# Patient Record
Sex: Male | Born: 1980 | Race: White | Hispanic: No | Marital: Married | Smoking: Former smoker
Health system: Southern US, Community
[De-identification: ages and names within clinical notes are randomized; demographics above are authoritative.]

## PROBLEM LIST (undated history)

## (undated) DIAGNOSIS — K219 Gastro-esophageal reflux disease without esophagitis: Secondary | ICD-10-CM

## (undated) HISTORY — PX: HERNIA REPAIR: SHX51

## (undated) HISTORY — DX: Gastro-esophageal reflux disease without esophagitis: K21.9

## (undated) HISTORY — PX: NO PAST SURGERIES: SHX2092

## (undated) HISTORY — PX: INGUINAL HERNIA REPAIR: SUR1180

---

## 1998-10-06 ENCOUNTER — Encounter: Payer: Self-pay | Admitting: Internal Medicine

## 1998-10-06 ENCOUNTER — Emergency Department (HOSPITAL_COMMUNITY): Admission: EM | Admit: 1998-10-06 | Discharge: 1998-10-06 | Payer: Self-pay | Admitting: Internal Medicine

## 2004-08-16 ENCOUNTER — Emergency Department (HOSPITAL_COMMUNITY): Admission: EM | Admit: 2004-08-16 | Discharge: 2004-08-16 | Payer: Self-pay | Admitting: Emergency Medicine

## 2004-09-26 ENCOUNTER — Emergency Department (HOSPITAL_COMMUNITY): Admission: EM | Admit: 2004-09-26 | Discharge: 2004-09-26 | Payer: Self-pay | Admitting: Emergency Medicine

## 2005-10-12 ENCOUNTER — Emergency Department (HOSPITAL_COMMUNITY): Admission: EM | Admit: 2005-10-12 | Discharge: 2005-10-12 | Payer: Self-pay | Admitting: Emergency Medicine

## 2011-09-10 ENCOUNTER — Ambulatory Visit: Payer: BC Managed Care – PPO

## 2011-09-10 ENCOUNTER — Ambulatory Visit (INDEPENDENT_AMBULATORY_CARE_PROVIDER_SITE_OTHER): Payer: BC Managed Care – PPO | Admitting: Emergency Medicine

## 2011-09-10 VITALS — BP 127/86 | HR 66 | Temp 98.0°F | Resp 16

## 2011-09-10 DIAGNOSIS — R079 Chest pain, unspecified: Secondary | ICD-10-CM

## 2011-09-10 DIAGNOSIS — M792 Neuralgia and neuritis, unspecified: Secondary | ICD-10-CM

## 2011-09-10 DIAGNOSIS — IMO0002 Reserved for concepts with insufficient information to code with codable children: Secondary | ICD-10-CM

## 2011-09-10 MED ORDER — MELOXICAM 7.5 MG PO TABS: ORAL_TABLET | ORAL | Status: DC

## 2011-09-10 MED ORDER — HYDROCODONE-ACETAMINOPHEN 5-325 MG PO TABS: ORAL_TABLET | ORAL | Status: DC

## 2011-09-10 NOTE — Patient Instructions (Signed)
Radicular Pain Radicular pain in either the arm or leg is usually from a bulging or herniated disk in the spine. A piece of the herniated disk may press against the nerves as the nerves exit the spine. This causes pain which is felt at the tips of the nerves down the arm or leg. Other causes of radicular pain may include:  Fractures.   Heart disease.   Cancer.   An abnormal and usually degenerative state of the nervous system or nerves (neuropathy).  Diagnosis may require CT or MRI scanning to determine the primary cause.  Nerves that start at the neck (nerve roots) may cause radicular pain in the outer shoulder and arm. It can spread down to the thumb and fingers. The symptoms vary depending on which nerve root has been affected. In most cases radicular pain improves with conservative treatment. Neck problems may require physical therapy, a neck collar, or cervical traction. Treatment may take many weeks, and surgery may be considered if the symptoms do not improve.  Conservative treatment is also recommended for sciatica. Sciatica causes pain to radiate from the lower back or buttock area down the leg into the foot. Often there is a history of back problems. Most patients with sciatica are better after 2 to 4 weeks of rest and other supportive care. Short term bed rest can reduce the disk pressure considerably. Sitting, however, is not a good position since this increases the pressure on the disk. You should avoid bending, lifting, and all other activities which make the problem worse. Traction can be used in severe cases. Surgery is usually reserved for patients who do not improve within the first months of treatment. Only take over-the-counter or prescription medicines for pain, discomfort, or fever as directed by your caregiver. Narcotics and muscle relaxants may help by relieving more severe pain and spasm and by providing mild sedation. Cold or massage can give significant relief. Spinal  manipulation is not recommended. It can increase the degree of disc protrusion. Epidural steroid injections are often effective treatment for radicular pain. These injections deliver medicine to the spinal nerve in the space between the protective covering of the spinal cord and back bones (vertebrae). Your caregiver can give you more information about steroid injections. These injections are most effective when given within two weeks of the onset of pain.  You should see your caregiver for follow up care as recommended. A program for neck and back injury rehabilitation with stretching and strengthening exercises is an important part of management.  SEEK IMMEDIATE MEDICAL CARE IF:  You develop increased pain, weakness, or numbness in your arm or leg.   You develop difficulty with bladder or bowel control.   You develop abdominal pain.  Document Released: 03/23/2004 Document Revised: 02/02/2011 Document Reviewed: 06/08/2008 [employer] Patient Information 2012 [employer]. 

## 2011-09-10 NOTE — Progress Notes (Signed)
  Subjective:    Patient ID: Mark Bright, male    DOB: 03/18/1980, 31 y.o.   MRN: [id number]  HPI patient enters with an episode of severe right-sided pain. This pain originated in his right forearm and traveled up his right deltoid area. He then developed severe pain on the right side of his chest and into his back he denies any pain in his neck. 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 He has not felt short of breath.    Review of Systems     Objective:   Physical Exam  Constitutional: He appears well-developed and well-nourished.  HENT:  Head: Normocephalic and atraumatic.  Right Ear: External ear normal.  Left Ear: External ear normal.  Eyes: Pupils are equal, round, and reactive to light.  Neck: No tracheal deviation present. No thyromegaly present.  Cardiovascular: Normal rate, regular rhythm and normal heart sounds.   Pulmonary/Chest: No respiratory distress. He has no wheezes. He has no rales.  Abdominal: He exhibits no distension. There is no tenderness.   EKG normal sinus rhythm no acute change  [facility] reading (PRIMARY) by  Dr. Cleta Alberts C-spine is no acute disease. Chest x-ray no acute disease       Assessment & Plan:  The pain he is having sounds nerve related to his severe aching-type sensation he feels in his right arm and has progressed to involve his chest the we'll treat with Mobic 7.5 twice a day with food and given hydrocodone at night. If he continues to have symptoms would advise referral to orthopedist

## 2011-11-01 ENCOUNTER — Encounter (HOSPITAL_BASED_OUTPATIENT_CLINIC_OR_DEPARTMENT_OTHER): Payer: Self-pay | Admitting: *Deleted

## 2011-11-01 ENCOUNTER — Emergency Department (HOSPITAL_BASED_OUTPATIENT_CLINIC_OR_DEPARTMENT_OTHER)
Admission: EM | Admit: 2011-11-01 | Discharge: 2011-11-02 | Disposition: A | Discharge status: Home / Self Care | Payer: BC Managed Care – PPO | Attending: Emergency Medicine | Admitting: Emergency Medicine

## 2011-11-01 ENCOUNTER — Emergency Department (HOSPITAL_BASED_OUTPATIENT_CLINIC_OR_DEPARTMENT_OTHER): Payer: BC Managed Care – PPO

## 2011-11-01 DIAGNOSIS — S3994XA Unspecified injury of external genitals, initial encounter: Secondary | ICD-10-CM

## 2011-11-01 DIAGNOSIS — S39848A Other specified injuries of external genitals, initial encounter: Secondary | ICD-10-CM | POA: Insufficient documentation

## 2011-11-01 DIAGNOSIS — Y9364 Activity, baseball: Secondary | ICD-10-CM | POA: Insufficient documentation

## 2011-11-01 DIAGNOSIS — Z880 Allergy status to penicillin: Secondary | ICD-10-CM | POA: Insufficient documentation

## 2011-11-01 DIAGNOSIS — W219XXA Striking against or struck by unspecified sports equipment, initial encounter: Secondary | ICD-10-CM | POA: Insufficient documentation

## 2011-11-01 LAB — URINALYSIS, ROUTINE W REFLEX MICROSCOPIC
Glucose, UA: NEGATIVE mg/dL
Leukocytes, UA: NEGATIVE
Nitrite: NEGATIVE
Specific Gravity, Urine: 1.028 (ref 1.005–1.030)
pH: 6 (ref 5.0–8.0)

## 2011-11-01 NOTE — ED Notes (Signed)
Pt c/o testicle pain after being hit by baseball x 1 hr ago

## 2011-11-01 NOTE — ED Provider Notes (Signed)
History     CSN: [id number]  Arrival date & time 11/01/11  2210   First MD Initiated Contact with Patient 11/01/11 2316      Chief Complaint  Patient presents with  . Testicle Pain    (Consider location/radiation/quality/duration/timing/severity/associated sxs/prior treatment) HPI Comments: Patient presents with pain, swelling in the left testicle after struck with a line drive softball during a game.  Has been able to void without difficulty.    Patient is a 31 y.o. male presenting with testicular pain. The history is provided by the patient.  Testicle Pain This is a new problem. The current episode started 1 to 2 hours ago. The problem occurs constantly. The problem has not changed since onset.Associated symptoms include abdominal pain. Exacerbated by: palpation, movement. Nothing relieves the symptoms. He has tried a cold compress for the symptoms. The treatment provided mild relief.    History reviewed. No pertinent past medical history.  History reviewed. No pertinent past surgical history.  History reviewed. No pertinent family history.  History  Substance Use Topics  . Smoking status: Never Smoker   . Smokeless tobacco: Not on file  . Alcohol Use: No      Review of Systems  Gastrointestinal: Positive for abdominal pain.  Genitourinary: Positive for testicular pain.  All other systems reviewed and are negative.    Allergies  Penicillins  Home Medications  No current outpatient prescriptions on file.  BP 105/45  Pulse 66  Temp 97.9 F (36.6 C) (Oral)  Resp 16  Ht 5\' 7"  (1.702 m)  Wt 150 lb (68.04 kg)  BMI 23.49 kg/m2  SpO2 100%  Physical Exam  Nursing note and vitals reviewed. Constitutional: He appears well-developed and well-nourished.  HENT:  Head: Normocephalic and atraumatic.  Neck: Normal range of motion. Neck supple.  Abdominal: Soft. Bowel sounds are normal. He exhibits no distension. There is no tenderness.  Genitourinary:       The  left testicle is ttp but minimal swelling and freely mobile within the scrotum.  Musculoskeletal: Normal range of motion. He exhibits no edema.  Skin: He is not diaphoretic.    ED Course  Procedures (including critical care time)  Labs Reviewed - No data to display No results found.   No diagnosis found.    MDM  The ultrasound fails to reveal any intrascrotal damage.  He will be discharged to home with pain meds, rest, time.  To return prn for any problems.        Geoffery Lyons, MD 11/02/11 [phone]

## 2011-11-02 MED ORDER — HYDROCODONE-ACETAMINOPHEN 5-500 MG PO TABS: 1.0000 | ORAL_TABLET | Freq: Four times a day (QID) | ORAL | Status: AC | PRN

## 2014-08-03 ENCOUNTER — Ambulatory Visit (INDEPENDENT_AMBULATORY_CARE_PROVIDER_SITE_OTHER): Payer: BLUE CROSS/BLUE SHIELD

## 2014-08-03 ENCOUNTER — Ambulatory Visit (INDEPENDENT_AMBULATORY_CARE_PROVIDER_SITE_OTHER): Payer: BLUE CROSS/BLUE SHIELD | Admitting: Emergency Medicine

## 2014-08-03 ENCOUNTER — Other Ambulatory Visit: Payer: Self-pay | Admitting: Emergency Medicine

## 2014-08-03 VITALS — BP 110/70 | HR 58 | Temp 98.1°F | Resp 14 | Ht 68.5 in | Wt 159.8 lb

## 2014-08-03 DIAGNOSIS — M5442 Lumbago with sciatica, left side: Secondary | ICD-10-CM

## 2014-08-03 DIAGNOSIS — R079 Chest pain, unspecified: Secondary | ICD-10-CM

## 2014-08-03 DIAGNOSIS — M79641 Pain in right hand: Secondary | ICD-10-CM

## 2014-08-03 MED ORDER — CYCLOBENZAPRINE HCL 10 MG PO TABS: ORAL_TABLET | ORAL | Status: DC

## 2014-08-03 MED ORDER — PREDNISONE 20 MG PO TABS: ORAL_TABLET | ORAL | Status: DC

## 2014-08-03 NOTE — Progress Notes (Signed)
Subjective:   This chart was scribed for [name], MD by Andrew Au, ED Scribe. This patient was seen in room 8 and the patient's care was started at 11:13 AM.   Patient ID: Mark Bright, male    DOB: 1980-06-03, 34 y.o.   MRN: [id number]  HPI   Chief Complaint  Patient presents with  . Painful breathing    x 2 days  . Back Pain    x 3 days  . Hand Pain    right hand 5th digit, describes pain as sharp x few weeks   HPI Comments:  Mark Bright is a 34 y.o. male who presents to the [facility] complaining of low back pain that began 3 days ago. He denies recent injury or unusual activities. Pt reports exacerbating pain when bending over and radiating pain to right leg with changing positions. Pt denies hx of back problems. Pt has hx of physical jobs including constructions, heat and air and roofing. He denies dysuria and hematuria.   Pt also has chest discomfort described as pressure but denies being SOB. Pt is a former smoker who quit 3 years ago.  Pt also has right 5th finger pain onset 2 weeks. 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    History reviewed. No pertinent past medical history.   Allergies  Allergen Reactions  . Penicillins     Describes black out episode after PCN injection 15 yrs ago   Prior to Admission medications   Not on File   Review of Systems  Respiratory: Negative for shortness of breath.   Cardiovascular: Positive for chest pain ( chest pressure).  Genitourinary: Negative for dysuria, hematuria, enuresis and difficulty urinating.  Musculoskeletal: Positive for myalgias, back pain and arthralgias.   Objective:   Physical Exam   CONSTITUTIONAL: Well developed/well nourished HEAD: Normocephalic/atraumatic EYES: EOMI/PERRL ENMT: Mucous membranes moist NECK: supple no meningeal signs SPINE/BACK:entire spine nontender CV: S1/S2 noted, no murmurs/rubs/gallops noted  LUNGS: Lungs are clear to  auscultation bilaterally, no apparent distress ABDOMEN: soft, nontender, no rebound or guarding, bowel sounds noted throughout abdomen. Lungs clear are clear to ascultation bilaterally. Breaths sounds normal bilaterally. GU:no cva tenderness NEURO: Pt is awake/alert/appropriate, moves all extremitiesx4.  No facial droop.  Patellar reflexes 2+ symmetrically. EXTREMITIES: pulses normal/equal, full ROM. Tender over the MCP of right 5th finger. Tender over lower lumbar spine left para spinal muscles. Straight leg raise caused pain at 80 degrees.  SKIN: warm, color normal PSYCH: no abnormalities of mood noted, alert and oriented to situation  Filed Vitals:   08/03/14 1004  BP: 110/70  Pulse: 58  Temp: 98.1 F (36.7 C)  TempSrc: Oral  Resp: 14  Height: 5' 8.5" (1.74 m)  Weight: 159 lb 12.8 oz (72.485 kg)  SpO2: 99%   [facility] reading (PRIMARY) by Dr. Cleta Alberts. CXR: heart sign normal no infiltrate or pneumothorax. 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 Right hand: no fracture seen.   Assessment & Plan:   1. Chest pain, unspecified chest pain type   2. Left-sided low back pain with left-sided sciatica   3. Hand pain, right     We'll treat with prednisone Dosepak with Flexeril at night he was given a note for this week to be out of work recheck 1 week if he is not significantly better I personally performed the services described in this documentation, which was scribed in my  presence. The recorded information has been reviewed and is accurate.  Earl LitesSteve Isack Lavalley, MD

## 2014-08-03 NOTE — Patient Instructions (Signed)
Sciatica Sciatica is pain, weakness, numbness, or tingling along the path of the sciatic nerve. The nerve starts in the lower back and runs down the back of each leg. The nerve controls the muscles in the lower leg and in the back of the knee, while also providing sensation to the back of the thigh, lower leg, and the sole of your foot. Sciatica is a symptom of another medical condition. For instance, nerve damage or certain conditions, such as a herniated disk or bone spur on the spine, pinch or put pressure on the sciatic nerve. This causes the pain, weakness, or other sensations normally associated with sciatica. Generally, sciatica only affects one side of the body. CAUSES   Herniated or slipped disc.  Degenerative disk disease.  A pain disorder involving the narrow muscle in the buttocks (piriformis syndrome).  Pelvic injury or fracture.  Pregnancy.  Tumor (rare). SYMPTOMS  Symptoms can vary from mild to very severe. The symptoms usually travel from the low back to the buttocks and down the back of the leg. Symptoms can include:  Mild tingling or dull aches in the lower back, leg, or hip.  Numbness in the back of the calf or sole of the foot.  Burning sensations in the lower back, leg, or hip.  Sharp pains in the lower back, leg, or hip.  Leg weakness.  Severe back pain inhibiting movement. These symptoms may get worse with coughing, sneezing, laughing, or prolonged sitting or standing. Also, being overweight may worsen symptoms. DIAGNOSIS  Your caregiver will perform a physical exam to look for common symptoms of sciatica. He or she may ask you to do certain movements or activities that would trigger sciatic nerve pain. Other tests may be performed to find the cause of the sciatica. These may include:  Blood tests.  X-rays.  Imaging tests, such as an MRI or CT scan. TREATMENT  Treatment is directed at the cause of the sciatic pain. Sometimes, treatment is not necessary  and the pain and discomfort goes away on its own. If treatment is needed, your caregiver may suggest:  Over-the-counter medicines to relieve pain.  Prescription medicines, such as anti-inflammatory medicine, muscle relaxants, or narcotics.  Applying heat or ice to the painful area.  Steroid injections to lessen pain, irritation, and inflammation around the nerve.  Reducing activity during periods of pain.  Exercising and stretching to strengthen your abdomen and improve flexibility of your spine. Your caregiver may suggest losing weight if the extra weight makes the back pain worse.  Physical therapy.  Surgery to eliminate what is pressing or pinching the nerve, such as a bone spur or part of a herniated disk. HOME CARE INSTRUCTIONS   Only take over-the-counter or prescription medicines for pain or discomfort as directed by your caregiver.  Apply ice to the affected area for 20 minutes, 3-4 times a day for the first 48-72 hours. Then try heat in the same way.  Exercise, stretch, or perform your usual activities if these do not aggravate your pain.  Attend physical therapy sessions as directed by your caregiver.  Keep all follow-up appointments as directed by your caregiver.  Do not wear high heels or shoes that do not provide proper support.  Check your mattress to see if it is too soft. A firm mattress may lessen your pain and discomfort. SEEK IMMEDIATE MEDICAL CARE IF:   You lose control of your bowel or bladder (incontinence).  You have increasing weakness in the lower back, pelvis, buttocks,   or legs.  You have redness or swelling of your back.  You have a burning sensation when you urinate.  You have pain that gets worse when you lie down or awakens you at night.  Your pain is worse than you have experienced in the past.  Your pain is lasting longer than 4 weeks.  You are suddenly losing weight without reason. MAKE SURE YOU:  Understand these  instructions.  Will watch your condition.  Will get help right away if you are not doing well or get worse. Document Released: 02/07/2001 Document Revised: 08/15/2011 Document Reviewed: 06/25/2011 [employer] Patient Information 2015 [employer]. This information is not intended to replace advice given to you by your health care provider. Make sure you discuss any questions you have with your health care provider.  

## 2015-09-27 ENCOUNTER — Encounter (HOSPITAL_COMMUNITY): Payer: Self-pay | Admitting: *Deleted

## 2015-09-27 DIAGNOSIS — K529 Noninfective gastroenteritis and colitis, unspecified: Secondary | ICD-10-CM | POA: Diagnosis not present

## 2015-09-27 DIAGNOSIS — R1031 Right lower quadrant pain: Secondary | ICD-10-CM | POA: Diagnosis present

## 2015-09-27 LAB — URINALYSIS, ROUTINE W REFLEX MICROSCOPIC
Bilirubin Urine: NEGATIVE
GLUCOSE, UA: NEGATIVE mg/dL
HGB URINE DIPSTICK: NEGATIVE
Ketones, ur: NEGATIVE mg/dL
Leukocytes, UA: NEGATIVE
Nitrite: NEGATIVE
PROTEIN: NEGATIVE mg/dL
Specific Gravity, Urine: 1.01 (ref 1.005–1.030)
pH: 6.5 (ref 5.0–8.0)

## 2015-09-27 LAB — COMPREHENSIVE METABOLIC PANEL
ALBUMIN: 4.2 g/dL (ref 3.5–5.0)
ALT: 15 U/L — AB (ref 17–63)
AST: 19 U/L (ref 15–41)
Alkaline Phosphatase: 60 U/L (ref 38–126)
Anion gap: 8 (ref 5–15)
BUN: 6 mg/dL (ref 6–20)
CHLORIDE: 104 mmol/L (ref 101–111)
CO2: 26 mmol/L (ref 22–32)
CREATININE: 1.11 mg/dL (ref 0.61–1.24)
Calcium: 9.5 mg/dL (ref 8.9–10.3)
GFR calc Af Amer: >60 mL/min (ref >60)
GFR calc non Af Amer: >60 mL/min (ref >60)
GLUCOSE: 132 mg/dL — AB (ref 65–99)
Potassium: 3.4 mmol/L — ABNORMAL LOW (ref 3.5–5.1)
SODIUM: 138 mmol/L (ref 135–145)
Total Bilirubin: 0.9 mg/dL (ref 0.3–1.2)
Total Protein: 6.2 g/dL — ABNORMAL LOW (ref 6.5–8.1)

## 2015-09-27 LAB — CBC
HCT: 42.7 % (ref 39.0–52.0)
Hemoglobin: 14.5 g/dL (ref 13.0–17.0)
MCH: 31 pg (ref 26.0–34.0)
MCHC: 34 g/dL (ref 30.0–36.0)
MCV: 91.2 fL (ref 78.0–100.0)
PLATELETS: 243 10*3/uL (ref 150–400)
RBC: 4.68 MIL/uL (ref 4.22–5.81)
RDW: 12.1 % (ref 11.5–15.5)
WBC: 13.8 10*3/uL — ABNORMAL HIGH (ref 4.0–10.5)

## 2015-09-27 LAB — LIPASE, BLOOD: LIPASE: 20 U/L (ref 11–51)

## 2015-09-27 NOTE — ED Triage Notes (Signed)
The pt is c/o lower abd pain since this am  No n v or diarrhea.  His urine is darker than usual

## 2015-09-28 ENCOUNTER — Emergency Department (HOSPITAL_COMMUNITY): Payer: 59

## 2015-09-28 ENCOUNTER — Observation Stay (HOSPITAL_COMMUNITY)
Admission: EM | Admit: 2015-09-28 | Discharge: 2015-09-29 | DRG: 392 | Disposition: A | Discharge status: Home / Self Care | Payer: 59 | Attending: Surgery | Admitting: Surgery

## 2015-09-28 ENCOUNTER — Encounter (HOSPITAL_COMMUNITY): Payer: Self-pay | Admitting: Radiology

## 2015-09-28 DIAGNOSIS — R109 Unspecified abdominal pain: Secondary | ICD-10-CM | POA: Diagnosis present

## 2015-09-28 LAB — CBC
HCT: 40.1 % (ref 39.0–52.0)
Hemoglobin: 13.2 g/dL (ref 13.0–17.0)
MCH: 30.4 pg (ref 26.0–34.0)
MCHC: 32.9 g/dL (ref 30.0–36.0)
MCV: 92.4 fL (ref 78.0–100.0)
PLATELETS: 204 10*3/uL (ref 150–400)
RBC: 4.34 MIL/uL (ref 4.22–5.81)
RDW: 12.1 % (ref 11.5–15.5)
WBC: 8.7 10*3/uL (ref 4.0–10.5)

## 2015-09-28 MED ORDER — ONDANSETRON 4 MG PO TBDP
4.0000 mg | ORAL_TABLET | Freq: Four times a day (QID) | ORAL | Status: DC | PRN
  Filled 2015-09-28: qty 1

## 2015-09-28 MED ORDER — ENOXAPARIN SODIUM 40 MG/0.4ML ~~LOC~~ SOLN: 40.0000 mg | SUBCUTANEOUS | Status: DC

## 2015-09-28 MED ORDER — ACETAMINOPHEN 500 MG PO TABS: 500.0000 mg | ORAL_TABLET | Freq: Four times a day (QID) | ORAL | Status: DC | PRN

## 2015-09-28 MED ORDER — SODIUM CHLORIDE 0.9 % IV BOLUS (SEPSIS)
1000.0000 mL | Freq: Once | INTRAVENOUS | Status: AC
  Administered 2015-09-28: 1000 mL via INTRAVENOUS

## 2015-09-28 MED ORDER — IOPAMIDOL (ISOVUE-300) INJECTION 61%
INTRAVENOUS | Status: AC
  Administered 2015-09-28: 100 mL
  Filled 2015-09-28: qty 100

## 2015-09-28 MED ORDER — MORPHINE SULFATE (PF) 4 MG/ML IV SOLN
4.0000 mg | Freq: Once | INTRAVENOUS | Status: AC
  Administered 2015-09-28: 4 mg via INTRAVENOUS
  Filled 2015-09-28: qty 1

## 2015-09-28 MED ORDER — CIPROFLOXACIN IN D5W 400 MG/200ML IV SOLN
400.0000 mg | Freq: Two times a day (BID) | INTRAVENOUS | Status: DC
  Administered 2015-09-28 – 2015-09-29 (×3): 400 mg via INTRAVENOUS
  Filled 2015-09-28 (×4): qty 200

## 2015-09-28 MED ORDER — METRONIDAZOLE IN NACL 5-0.79 MG/ML-% IV SOLN
500.0000 mg | Freq: Three times a day (TID) | INTRAVENOUS | Status: DC
  Administered 2015-09-28 – 2015-09-29 (×4): 500 mg via INTRAVENOUS
  Filled 2015-09-28 (×6): qty 100

## 2015-09-28 MED ORDER — KCL IN DEXTROSE-NACL 20-5-0.9 MEQ/L-%-% IV SOLN
INTRAVENOUS | Status: DC
  Administered 2015-09-28 – 2015-09-29 (×2): via INTRAVENOUS
  Filled 2015-09-28 (×2): qty 1000

## 2015-09-28 MED ORDER — ONDANSETRON HCL 4 MG/2ML IJ SOLN
4.0000 mg | Freq: Once | INTRAMUSCULAR | Status: AC
  Administered 2015-09-28: 4 mg via INTRAVENOUS
  Filled 2015-09-28: qty 2

## 2015-09-28 MED ORDER — ONDANSETRON HCL 4 MG/2ML IJ SOLN: 4.0000 mg | Freq: Four times a day (QID) | INTRAMUSCULAR | Status: DC | PRN

## 2015-09-28 MED ORDER — HYDROMORPHONE HCL 1 MG/ML IJ SOLN
1.0000 mg | INTRAMUSCULAR | Status: DC | PRN
  Administered 2015-09-28 – 2015-09-29 (×4): 1 mg via INTRAVENOUS
  Filled 2015-09-28 (×4): qty 1

## 2015-09-28 MED ORDER — IBUPROFEN 200 MG PO TABS
400.0000 mg | ORAL_TABLET | Freq: Four times a day (QID) | ORAL | Status: DC | PRN
  Administered 2015-09-29: 400 mg via ORAL
  Filled 2015-09-28: qty 2

## 2015-09-28 NOTE — ED Provider Notes (Signed)
TIME SEEN: 2:14 am CHIEF COMPLAINT: abdominal pain  HPI: The patient presents to the ER with complaints of abdominal pain to the lower abdomen and radiates towards the right lower quadrant. It started yesterday morning, he has had associated nausea and anorexia. He has not tried anything for pain. He denies having any diarrhea or abnormal bleeding. He denies having any vomiting or fevers. He has never had abdominal surgery in the past and denies ever having abdominal pain like this in the past.  ROS: See HPI Constitutional: no fever  Eyes: no drainage  ENT: no runny nose   Cardiovascular:  no chest pain  Resp: no SOB  GI: no vomiting GU: no dysuria Integumentary: no rash  Allergy: no hives  Musculoskeletal: no leg swelling  Neurological: no slurred speech ROS otherwise negative  PAST MEDICAL HISTORY/PAST SURGICAL HISTORY:  History reviewed. No pertinent past medical history.  MEDICATIONS:  Prior to Admission medications   Medication Sig Start Date End Date Taking? Authorizing Provider  cyclobenzaprine (FLEXERIL) 10 MG tablet Take 1 tablet at night Patient not taking: Reported on 09/28/2015 08/03/14   Collene Gobble, MD  predniSONE (DELTASONE) 20 MG tablet Take 3 a dayfor 3 days 2 a day for 3 days one a day for 3 days Patient not taking: Reported on 09/28/2015 08/03/14   Collene Gobble, MD    ALLERGIES:  Allergies  Allergen Reactions  . Penicillins     Describes black out episode after PCN injection 15 yrs ago    SOCIAL HISTORY:  Social History  Substance Use Topics  . Smoking status: Never Smoker  . Smokeless tobacco: Never Used  . Alcohol use No    FAMILY HISTORY: No family history on file.  EXAM: BP 99/68   Pulse 63   Temp 98.3 F (36.8 C)   Resp 16   Ht 5\' 7"  (1.702 m)   Wt 70.6 kg   SpO2 98%   BMI 24.36 kg/m  CONSTITUTIONAL: Alert and oriented and responds appropriately to questions. Well-appearing; well-nourished HEAD: Normocephalic EYES: Conjunctivae clear,  PERRL ENT: normal nose; no rhinorrhea; moist mucous membranes NECK: Supple, no meningismus, no LAD  CARD: RRR;  RESP: Normal chest excursion without splinting or tachypnea; breath sounds clear and equal bilaterally; no wheezes, no rhonchi, no rales, no hypoxia or respiratory distress, speaking full sentences ABD/GI: Normal bowel sounds; non-distended; soft. Pt is tender between the periumbilical region and suprapubic region that radiates towards the right lower quadrant. He has voluntary guarding. BACK:  The back appears normal and is non-tender to palpation, there is no CVA tenderness EXT: Normal ROM in all joints; non-tender to palpation; no edema; normal capillary refill; no cyanosis, no calf tenderness or swelling    SKIN: Normal color for age and race; warm; no rash NEURO: Moves all extremities equally, sensation to light touch intact diffusely, cranial nerves II through XII intact PSYCH: The patient's mood and manner are appropriate. Grooming and personal hygiene are appropriate.  MEDICAL DECISION MAKING: pt has a leukocytosis and CT scan shows possible early appendicitis vs enteritis. Dr. Mignon Pine with [facility] was consulted. He saw the patient and will admit for watchful waiting.  Medications  enoxaparin (LOVENOX) injection 40 mg (not administered)  dextrose 5 % and 0.9 % NaCl with KCl 20 mEq/L infusion (not administered)  ciprofloxacin (CIPRO) IVPB 400 mg (not administered)    And  metroNIDAZOLE (FLAGYL) IVPB 500 mg (not administered)  HYDROmorphone (DILAUDID) injection 1 mg (not administered)  ondansetron (ZOFRAN-ODT) disintegrating  tablet 4 mg (not administered)    Or  ondansetron (ZOFRAN) injection 4 mg (not administered)  morphine 4 MG/ML injection 4 mg (4 mg Intravenous Given 09/28/15 0231)  sodium chloride 0.9 % bolus 1,000 mL (1,000 mLs Intravenous New Bag/Given 09/28/15 0230)  ondansetron (ZOFRAN) injection 4 mg (4 mg Intravenous Given 09/28/15 0231)  iopamidol (ISOVUE-300) 61 %  injection (100 mLs  Contrast Given 09/28/15 0246)      Marlon Pel, PA-C 09/28/15 4098    Shon Baton, MD 09/28/15 2251

## 2015-09-28 NOTE — H&P (Signed)
Mark Bright is an 35 y.o. male.   Chief Complaint: abdominal pain HPI:  1 day hx of lower abdominal pain for 24 hours Location RLQ and suprapubic area No vomitng or diarrhea No blood in his stool Pain less now but still present Sharp in nature with cramps  History reviewed. No pertinent past medical history.  History reviewed. No pertinent surgical history.  No family history on file. Social History:  reports that he has never smoked. He has never used smokeless tobacco. He reports that he does not drink alcohol or use drugs.  Allergies:  Allergies  Allergen Reactions  . Penicillins     Describes black out episode after PCN injection 15 yrs ago     (Not in a hospital admission)  Results for orders placed or performed during the hospital encounter of 09/28/15 (from the past 48 hour(s))  Lipase, blood     Status: None   Collection Time: 09/27/15  9:28 PM  Result Value Ref Range   Lipase 20 11 - 51 U/L  Comprehensive metabolic panel     Status: Abnormal   Collection Time: 09/27/15  9:28 PM  Result Value Ref Range   Sodium 138 135 - 145 mmol/L   Potassium 3.4 (L) 3.5 - 5.1 mmol/L   Chloride 104 101 - 111 mmol/L   CO2 26 22 - 32 mmol/L   Glucose, Bld 132 (H) 65 - 99 mg/dL   BUN 6 6 - 20 mg/dL   Creatinine, Ser 1.11 0.61 - 1.24 mg/dL   Calcium 9.5 8.9 - 10.3 mg/dL   Total Protein 6.2 (L) 6.5 - 8.1 g/dL   Albumin 4.2 3.5 - 5.0 g/dL   AST 19 15 - 41 U/L   ALT 15 (L) 17 - 63 U/L   Alkaline Phosphatase 60 38 - 126 U/L   Total Bilirubin 0.9 0.3 - 1.2 mg/dL   GFR calc non Af Amer >60 >60 mL/min   GFR calc Af Amer >60 >60 mL/min    Comment: (NOTE) The eGFR has been calculated using the CKD EPI equation. This calculation has not been validated in all clinical situations. eGFR's persistently <60 mL/min signify possible Chronic Kidney Disease.    Anion gap 8 5 - 15  CBC     Status: Abnormal   Collection Time: 09/27/15  9:28 PM  Result Value Ref Range   WBC 13.8 (H)  4.0 - 10.5 K/uL   RBC 4.68 4.22 - 5.81 MIL/uL   Hemoglobin 14.5 13.0 - 17.0 g/dL   HCT 42.7 39.0 - 52.0 %   MCV 91.2 78.0 - 100.0 fL   MCH 31.0 26.0 - 34.0 pg   MCHC 34.0 30.0 - 36.0 g/dL   RDW 12.1 11.5 - 15.5 %   Platelets 243 150 - 400 K/uL  Urinalysis, Routine w reflex microscopic     Status: None   Collection Time: 09/27/15  9:36 PM  Result Value Ref Range   Color, Urine YELLOW YELLOW   APPearance CLEAR CLEAR   Specific Gravity, Urine 1.010 1.005 - 1.030   pH 6.5 5.0 - 8.0   Glucose, UA NEGATIVE NEGATIVE mg/dL   Hgb urine dipstick NEGATIVE NEGATIVE   Bilirubin Urine NEGATIVE NEGATIVE   Ketones, ur NEGATIVE NEGATIVE mg/dL   Protein, ur NEGATIVE NEGATIVE mg/dL   Nitrite NEGATIVE NEGATIVE   Leukocytes, UA NEGATIVE NEGATIVE    Comment: MICROSCOPIC NOT DONE ON URINES WITH NEGATIVE PROTEIN, BLOOD, LEUKOCYTES, NITRITE, OR GLUCOSE <1000 mg/dL.   Ct Abdomen  Pelvis W Contrast  Result Date: 09/28/2015 CLINICAL DATA:  Lower abdominal pain beginning this morning. Dark urine. EXAM: CT ABDOMEN AND PELVIS WITH CONTRAST TECHNIQUE: Multidetector CT imaging of the abdomen and pelvis was performed using the standard protocol following bolus administration of intravenous contrast. CONTRAST:  165m ISOVUE-300 IOPAMIDOL (ISOVUE-300) INJECTION 61% COMPARISON:  None. FINDINGS: LUNG BASES: Mild placenta atelectasis. Visualized heart and pericardium are unremarkable. Mild gas distended esophagus associated with reflux. SOLID ORGANS: The liver, spleen, gallbladder, pancreas and adrenal glands are unremarkable. GASTROINTESTINAL TRACT: The stomach, small and large bowel are normal in course and caliber without inflammatory changes. A few loops of thickened small bowel. Small large bowel air-fluid levels. Appendix is enlarged 9 mm with mild periappendiceal inflammation, no appendicolith. KIDNEYS/ URINARY TRACT: Kidneys are orthotopic, demonstrating symmetric enhancement. No nephrolithiasis, hydronephrosis or solid  renal masses. The unopacified ureters are normal in course and caliber. Urinary bladder is partially distended and unremarkable. PERITONEUM/RETROPERITONEUM: Aortoiliac vessels are normal in course and caliber, trace calcific atherosclerosis. No lymphadenopathy by CT size criteria. Prominent inguinal lymph nodes are likely reactive. Prostate size is normal. No intraperitoneal free fluid nor free air. SOFT TISSUE/OSSEOUS STRUCTURES: Non-suspicious. Small hydroceles partially imaged. IMPRESSION: CT findings of enteritis. Early appendicitis, less likely reactive changes secondary to enteritis. Electronically Signed   By: CElon AlasM.D.   On: 09/28/2015 03:25    Review of Systems  Constitutional: Negative for chills and fever.  Respiratory: Negative.   Cardiovascular: Negative.   Gastrointestinal: Positive for abdominal pain, nausea and vomiting. Negative for blood in stool and diarrhea.  Genitourinary: Negative for flank pain.  Musculoskeletal: Negative.   Skin: Positive for rash.  Neurological: Negative.     Blood pressure 98/64, pulse 61, temperature 98.3 F (36.8 C), resp. rate 16, height _0  (1.702 m), weight 70.6 kg (155 lb 9 oz), SpO2 98 %. Physical Exam  Constitutional: He is oriented to person, place, and time. He appears well-developed and well-nourished.  HENT:  Head: Normocephalic and atraumatic.  Eyes: Pupils are equal, round, and reactive to light. No scleral icterus.  Neck: Normal range of motion. Neck supple.  Cardiovascular: Normal rate and regular rhythm.   Respiratory: Effort normal and breath sounds normal.  GI: Soft. There is tenderness in the right lower quadrant and suprapubic area. There is tenderness at McBurney's point. There is no rigidity, no rebound and no guarding.  Neurological: He is alert and oriented to person, place, and time. GCS eye subscore is 4. GCS verbal subscore is 5. GCS motor subscore is 6.     Assessment/Plan Abdominal pain CT shows  enteritis and possible early appendicitis Exam equivical Admit  IVF start ABX  Recheck later this am May need laparoscopy to sort out per Dr WMittie Bodo, MD 09/28/2015, 4:41 AM

## 2015-09-28 NOTE — Progress Notes (Signed)
Patient is better.  Will start on clear liquids.  Marta Lamas. Gae Bon, MD, FACS [phone] [facility]

## 2015-09-28 NOTE — Progress Notes (Signed)
Called on-call MD and requested ibuprofen for patient.

## 2015-09-28 NOTE — ED Notes (Signed)
Patient transported to CT 

## 2015-09-28 NOTE — Progress Notes (Signed)
IV site infiltrated with cipro running. Rash noted on trunk. Patient states he has an allergy to certain fabrics and laundry detergents. He changed into his clothes from home and will tell us if he wants benedryl. He wants to wait to see if rash improves on its own.

## 2015-09-29 MED ORDER — METRONIDAZOLE 500 MG PO TABS: 500.0000 mg | ORAL_TABLET | Freq: Three times a day (TID) | ORAL | 0 refills | Status: DC

## 2015-09-29 MED ORDER — ONDANSETRON 4 MG PO TBDP: 4.0000 mg | ORAL_TABLET | Freq: Four times a day (QID) | ORAL | 0 refills | Status: DC | PRN

## 2015-09-29 MED ORDER — CIPROFLOXACIN HCL 500 MG PO TABS: 500.0000 mg | ORAL_TABLET | Freq: Two times a day (BID) | ORAL | 0 refills | Status: DC

## 2015-09-29 MED ORDER — CIPROFLOXACIN HCL 500 MG PO TABS
500.0000 mg | ORAL_TABLET | Freq: Two times a day (BID) | ORAL | Status: DC
  Administered 2015-09-29: 500 mg via ORAL
  Filled 2015-09-29: qty 1

## 2015-09-29 MED ORDER — METRONIDAZOLE 500 MG PO TABS
500.0000 mg | ORAL_TABLET | Freq: Three times a day (TID) | ORAL | Status: DC
  Administered 2015-09-29: 500 mg via ORAL
  Filled 2015-09-29: qty 1

## 2015-09-29 MED ORDER — ACETAMINOPHEN 500 MG PO TABS: 500.0000 mg | ORAL_TABLET | Freq: Four times a day (QID) | ORAL | 0 refills | Status: DC | PRN

## 2015-09-29 NOTE — Discharge Instructions (Signed)

## 2015-09-29 NOTE — Progress Notes (Signed)
Reviewed discharge information/medications with patient.  Answered all of his questions. IV removed.

## 2015-09-29 NOTE — Discharge Summary (Signed)
Physician Discharge Summary  Mark Bright ZOX:[id number] DOB: 09-13-1980 DOA: 09/28/2015  PCP: No primary care provider on file.  Consultation: None   Admit date: 09/28/2015 Discharge date: 09/29/2015   Discharge Diagnoses:  1. Gastroenteritis    Surgical Procedure: None   Discharge Condition: Stable  Disposition: Home   Diet recommendation: As tolerated   Filed Weights   09/27/15 2116  Weight: 155 lb 9 oz (70.6 kg)       Hospital Course:  Patient presented to the ED at [facility] with RLQ and suprapubic sharp and craming abdominal pain without vomiting or changes in bowel habits. WBC 13.8. CT abdomen conducted in the ED indicated findings consistent with enteritis or early appendicitis. Patient was started on ciprofloxacin and flagyl and given bowel rest. On Hospital Day#3, patient was afebrile, tolerating a diet and passing flatus. Abdominal pain was controlled. WBC 8.7. Patient was deemed ready for discharge and transitioned to oral antibiotics for one week.     Physical Exam:  General appearance  Supine. Awake and talkative.  Head: Normocephalic/atraumatic. PERRL bilaterally. Extraocular eye muscles in tact.  Neck: FROM Cardiovascular: Regular rate and rhythm without gallops, murmurs or rubs Respiratory: Lung sounds vesicular. No wheezes, crackles or rhonchi auscultated Abdomen: Soft. Expected tenderness in right lower quadrant. No masses or rigidity. No guarding. +BS    Prior to Admission medications   Medication Sig Start Date End Date Taking? Authorizing Provider  cyclobenzaprine (FLEXERIL) 10 MG tablet Take 1 tablet at night Patient not taking: Reported on 09/28/2015 08/03/14   Collene Gobble, MD  predniSONE (DELTASONE) 20 MG tablet Take 3 a dayfor 3 days 2 a day for 3 days one a day for 3 days Patient not taking: Reported on 09/28/2015 08/03/14   Collene Gobble, MD         The results of significant diagnostics from this hospitalization (including imaging, microbiology,  ancillary and laboratory) are listed below for reference.    Significant Diagnostic Studies: Ct Abdomen Pelvis W Contrast  Result Date: 09/28/2015 CLINICAL DATA:  Lower abdominal pain beginning this morning. Dark urine. EXAM: CT ABDOMEN AND PELVIS WITH CONTRAST TECHNIQUE: Multidetector CT imaging of the abdomen and pelvis was performed using the standard protocol following bolus administration of intravenous contrast. CONTRAST:  ISOVUE-300 IOPAMIDOL (ISOVUE-300) INJECTION 61% COMPARISON:  None. FINDINGS: LUNG BASES: Mild placenta atelectasis. Visualized heart and pericardium are unremarkable. Mild gas distended esophagus associated with reflux. SOLID ORGANS: The liver, spleen, gallbladder, pancreas and adrenal glands are unremarkable. GASTROINTESTINAL TRACT: The stomach, small and large bowel are normal in course and caliber without inflammatory changes. A few loops of thickened small bowel. Small large bowel air-fluid levels. Appendix is enlarged 9 mm with mild periappendiceal inflammation, no appendicolith. KIDNEYS/ URINARY TRACT: Kidneys are orthotopic, demonstrating symmetric enhancement. No nephrolithiasis, hydronephrosis or solid renal masses. The unopacified ureters are normal in course and caliber. Urinary bladder is partially distended and unremarkable. PERITONEUM/RETROPERITONEUM: Aortoiliac vessels are normal in course and caliber, trace calcific atherosclerosis. No lymphadenopathy by CT size criteria. Prominent inguinal lymph nodes are likely reactive. Prostate size is normal. No intraperitoneal free fluid nor free air. SOFT TISSUE/OSSEOUS STRUCTURES: Non-suspicious. Small hydroceles partially imaged. IMPRESSION: CT findings of enteritis. Early appendicitis, less likely reactive changes secondary to enteritis. Electronically Signed   By: Awilda Metro M.D.   On: 09/28/2015 03:25    Microbiology: No results found for this or any previous visit (from the past 240 hour(s)).   Labs: Basic  Metabolic Panel:  Recent Labs Lab 09/27/15 2128  NA 138  K 3.4*  CL 104  CO2 26  GLUCOSE 132*  BUN 6  CREATININE 1.11  CALCIUM 9.5   Liver Function Tests:  Recent Labs Lab 09/27/15 2128  AST 19  ALT 15*  ALKPHOS 60  BILITOT 0.9  PROT 6.2*  ALBUMIN 4.2    Recent Labs Lab 09/27/15 2128  LIPASE 20   No results for input(s): AMMONIA in the last 168 hours. CBC:  Recent Labs Lab 09/27/15 2128 09/28/15 0534  WBC 13.8* 8.7  HGB 14.5 13.2  HCT 42.7 40.1  MCV 91.2 92.4  PLT 243 204   Cardiac Enzymes: No results for input(s): CKTOTAL, CKMB, CKMBINDEX, TROPONINI in the last 168 hours. BNP: BNP (last 3 results) No results for input(s): BNP in the last 8760 hours.  ProBNP (last 3 results) No results for input(s): PROBNP in the last 8760 hours.  CBG: No results for input(s): GLUCAP in the last 168 hours.  Active Problems:   Abdominal pain   Time coordinating discharge: <30 minutes   Signed:  Pia Mau PASII

## 2016-04-17 ENCOUNTER — Ambulatory Visit (INDEPENDENT_AMBULATORY_CARE_PROVIDER_SITE_OTHER): Payer: 59 | Admitting: Physician Assistant

## 2016-04-17 VITALS — BP 100/74 | HR 58 | Temp 98.1°F | Resp 16 | Ht 67.0 in | Wt 157.0 lb

## 2016-04-17 DIAGNOSIS — K089 Disorder of teeth and supporting structures, unspecified: Secondary | ICD-10-CM

## 2016-04-17 DIAGNOSIS — J029 Acute pharyngitis, unspecified: Secondary | ICD-10-CM

## 2016-04-17 DIAGNOSIS — H9202 Otalgia, left ear: Secondary | ICD-10-CM

## 2016-04-17 LAB — POCT RAPID STREP A (OFFICE): RAPID STREP A SCREEN: NEGATIVE

## 2016-04-17 MED ORDER — IPRATROPIUM BROMIDE 0.03 % NA SOLN: 2.0000 | Freq: Two times a day (BID) | NASAL | 0 refills | Status: DC

## 2016-04-17 MED ORDER — CHLORHEXIDINE GLUCONATE 0.12 % MT SOLN: 15.0000 mL | Freq: Two times a day (BID) | OROMUCOSAL | 3 refills | Status: DC

## 2016-04-17 MED ORDER — MELOXICAM 15 MG PO TABS: 15.0000 mg | ORAL_TABLET | Freq: Every day | ORAL | 0 refills | Status: DC

## 2016-04-17 MED ORDER — GUAIFENESIN ER 1200 MG PO TB12: 1.0000 | ORAL_TABLET | Freq: Two times a day (BID) | ORAL | 1 refills | Status: DC | PRN

## 2016-04-17 NOTE — Progress Notes (Signed)
Patient ID: Mark Bright, male    DOB: 24-Sep-1980, 36 y.o.   MRN: [id number]  PCP: No primary care provider on file.  Chief Complaint  Patient presents with  . Sore Throat    x3 days  . Ear Problem    left ear; causing bad headache  . Fever    had fever on friday; took meds and has not returned    Subjective:   Presents for evaluation of sore throat and LEFT ear pain x 3 days.  Associated low grade fever (Tmax 100), morning congestion, cough with lying down and headache. Mild, brief episodes of nausea. No vomiting or diarrhea. No GU symptoms. Both daughters have UPI-type illnesses. No known strep throat contacts. No known flu contacts. No seasonal flu vaccine ever, "I just don't get those, and I've never had the flu."  Frequent dental pain. Saving up for full dental extraction and dentures. This is a long-standing problem for which he sees a Education officer, community.   Review of Systems As above. No CP, SOB, dizziness. No muscle or joint pain. No rash.    There are no active problems to display for this patient.    Prior to Admission medications   Not on File     Allergies  Allergen Reactions  . Penicillins     Describes black out episode after PCN injection 15 yrs ago       Objective:  Physical Exam  Constitutional: He is oriented to person, place, and time. He appears well-developed and well-nourished. He is active and cooperative. No distress.  BP 100/74   Pulse (!) 58   Temp 98.1 F (36.7 C) (Oral)   Resp 16   Ht 5\' 7"  (1.702 m)   Wt 157 lb (71.2 kg)   SpO2 96%   BMI 24.59 kg/m   HENT:  Head: Normocephalic and atraumatic.  Right Ear: Hearing, tympanic membrane, external ear and ear canal normal.  Left Ear: Hearing, tympanic membrane, external ear and ear canal normal.  Nose: Nose normal. Right sinus exhibits no maxillary sinus tenderness and no frontal sinus tenderness. Left sinus exhibits no maxillary sinus tenderness and no frontal sinus tenderness.    Mouth/Throat: Uvula is midline, oropharynx is clear and moist and mucous membranes are normal. Dental caries present. No uvula swelling.  Eyes: Conjunctivae are normal. No scleral icterus.  Neck: Normal range of motion, full passive range of motion without pain and phonation normal. Neck supple. No thyromegaly present.  Cardiovascular: Normal rate, regular rhythm and normal heart sounds.   Pulses:      Radial pulses are 2+ on the right side, and 2+ on the left side.  Pulmonary/Chest: Effort normal and breath sounds normal.  Lymphadenopathy:       Head (right side): No tonsillar, no preauricular, no posterior auricular and no occipital adenopathy present.       Head (left side): No tonsillar, no preauricular, no posterior auricular and no occipital adenopathy present.    He has no cervical adenopathy.       Right: No supraclavicular adenopathy present.       Left: No supraclavicular adenopathy present.  Neurological: He is alert and oriented to person, place, and time. No sensory deficit.  Skin: Skin is warm, dry and intact. No rash noted. No cyanosis or erythema. Nails show no clubbing.  Psychiatric: He has a normal mood and affect. His speech is normal and behavior is normal.    Results for orders placed or performed  in visit on 04/17/16  POCT rapid strep A  Result Value Ref Range   Rapid Strep A Screen Negative Negative          Assessment & Plan:   1. Sore throat Likely due to viral URI. Supportive care. Await TCx. - POCT rapid strep A - Culture, Group A Strep  2. Ear pain, left Likely due to ETD secondary to viral URI. Supportive care. - ipratropium (ATROVENT) 0.03 % nasal spray; Place 2 sprays into both nostrils 2 (two) times daily.  Dispense: 30 mL; Refill: 0 - Guaifenesin (MUCINEX MAXIMUM STRENGTH) 1200 MG TB12; Take 1 tablet (1,200 mg total) by mouth every 12 (twelve) hours as needed.  Dispense: 14 tablet; Refill: 1 - meloxicam (MOBIC) 15 MG tablet; Take 1 tablet (15  mg total) by mouth daily.  Dispense: 30 tablet; Refill: 0  3. Poor dentition Continue frequent brushing and flossing. Add Peridex. Follow-up with DDS as planned. - chlorhexidine (PERIDEX) 0.12 % solution; Use as directed 15 mLs in the mouth or throat 2 (two) times daily.  Dispense: 120 mL; Refill: 3   Fernande Brashelle S. Yolanda Huffstetler, PA-C Physician Assistant-Certified Primary Care at [facility]

## 2016-04-17 NOTE — Patient Instructions (Addendum)
Get plenty of rest and drink at least 64 ounces of water daily.    IF you received an x-ray today, you will receive an invoice from [facility]. Please contact [facility] at [phone] with questions or concerns regarding your invoice.   IF you received labwork today, you will receive an invoice from [facility]. Please contact [facility] at [phone] with questions or concerns regarding your invoice.   Our billing staff will not be able to assist you with questions regarding bills from these companies.  You will be contacted with the lab results as soon as they are available. The fastest way to get your results is to activate your My Chart account. Instructions are located on the last page of this paperwork. If you have not heard from us regarding the results in 2 weeks, please contact this office.      

## 2016-04-20 LAB — CULTURE, GROUP A STREP

## 2016-04-20 MED ORDER — AZITHROMYCIN 250 MG PO TABS: ORAL_TABLET | ORAL | 0 refills | Status: DC

## 2016-04-20 NOTE — Addendum Note (Signed)
Addended by: Fernande BrasJEFFERY, Mikella Linsley S on: 04/20/2016 04:04 PM   Modules accepted: Orders

## 2017-06-19 ENCOUNTER — Encounter: Payer: Self-pay | Admitting: Internal Medicine

## 2017-06-19 ENCOUNTER — Ambulatory Visit: Payer: 59 | Admitting: Internal Medicine

## 2017-06-19 VITALS — BP 124/82 | HR 70 | Temp 98.2°F | Ht 66.5 in | Wt 155.0 lb

## 2017-06-19 DIAGNOSIS — K089 Disorder of teeth and supporting structures, unspecified: Secondary | ICD-10-CM

## 2017-06-19 DIAGNOSIS — G8929 Other chronic pain: Secondary | ICD-10-CM | POA: Diagnosis not present

## 2017-06-19 LAB — COMPREHENSIVE METABOLIC PANEL
ALBUMIN: 4.4 g/dL (ref 3.5–5.2)
ALK PHOS: 64 U/L (ref 39–117)
ALT: 13 U/L (ref 0–53)
AST: 15 U/L (ref 0–37)
BUN: 9 mg/dL (ref 6–23)
CALCIUM: 9.6 mg/dL (ref 8.4–10.5)
CHLORIDE: 105 meq/L (ref 96–112)
CO2: 31 mEq/L (ref 19–32)
Creatinine, Ser: 1.13 mg/dL (ref 0.40–1.50)
GFR: 76.92 mL/min (ref >60.00)
Glucose, Bld: 77 mg/dL (ref 70–99)
Potassium: 3.9 mEq/L (ref 3.5–5.1)
SODIUM: 140 meq/L (ref 135–145)
TOTAL PROTEIN: 6.6 g/dL (ref 6.0–8.3)
Total Bilirubin: 0.7 mg/dL (ref 0.2–1.2)

## 2017-06-19 NOTE — Progress Notes (Signed)
HPI  Pt presents to the clinic today to establish care. He has not had a PCP in many years.  He reports he has been trying to have some dental work done. He has been to numerous dentist who can not numb him adequately despite using Novocaine and Nitrous Oxide. He can have an oral surgeon do this, but reports he needs a letter of medical clearance so that insurance will cover this.  Flu: never Tetanus: 8-9 years ago Dentist: as needed  No past medical history on file.  No current outpatient medications on file.   No current facility-administered medications for this visit.     Allergies  Allergen Reactions  . Penicillins     Describes black out episode after PCN injection 15 yrs ago    Family History  Problem Relation Age of Onset  . COPD Mother   . Arthritis Mother   . Hypertension Mother   . Diabetes Brother     Social History   Socioeconomic History  . Marital status: Married    Spouse name: Brook  . Number of children: 2  . Years of education: HS Diploma + trade classes  . Highest education level: Not on file  Occupational History  . Occupation: Tax adviserHVAC/plumbing manager  Social Needs  . Financial resource strain: Not on file  . Food insecurity:    Worry: Not on file    Inability: Not on file  . Transportation needs:    Medical: Not on file    Non-medical: Not on file  Tobacco Use  . Smoking status: Former Smoker    Packs/day: 0.50  . Smokeless tobacco: Never Used  Substance and Sexual Activity  . Alcohol use: No  . Drug use: No  . Sexual activity: Yes    Partners: Female  Lifestyle  . Physical activity:    Days per week: Not on file    Minutes per session: Not on file  . Stress: Not on file  Relationships  . Social connections:    Talks on phone: Not on file    Gets together: Not on file    Attends religious service: Not on file    Active member of club or organization: Not on file    Attends meetings of clubs or organizations: Not on file   Relationship status: Not on file  . Intimate partner violence:    Fear of current or ex partner: Not on file    Emotionally abused: Not on file    Physically abused: Not on file    Forced sexual activity: Not on file  Other Topics Concern  . Not on file  Social History Narrative   Lives with his wife and their 2 daughters.    ROS:  Constitutional: Denies fever, malaise, fatigue, headache or abrupt weight changes.  HEENT: Pt reports chronic dental pain. Denies eye pain, eye redness, ear pain, ringing in the ears, wax buildup, runny nose, nasal congestion, bloody nose, or sore throat. Respiratory: Denies difficulty breathing, shortness of breath, cough or sputum production.   Cardiovascular: Denies chest pain, chest tightness, palpitations or swelling in the hands or feet.  Gastrointestinal: Denies abdominal pain, bloating, constipation, diarrhea or blood in the stool.  GU: Denies frequency, urgency, pain with urination, blood in urine, odor or discharge. Musculoskeletal: Denies decrease in range of motion, difficulty with gait, muscle pain or joint pain and swelling.  Skin: Denies redness, rashes, lesions or ulcercations.  Neurological: Denies dizziness, difficulty with memory, difficulty with speech or problems  with balance and coordination.  Psych: Denies anxiety, depression, SI/HI.  No other specific complaints in a complete review of systems (except as listed in HPI above).  PE:  BP 124/82 (BP Location: Right Arm, Patient Position: Sitting, Cuff Size: Normal)   Pulse 70   Temp 98.2 F (36.8 C) (Oral)   Ht 5' 6.5" (1.689 m)   Wt 155 lb (70.3 kg)   SpO2 96%   BMI 24.64 kg/m  Wt Readings from Last 3 Encounters:  06/19/17 155 lb (70.3 kg)  04/17/16 157 lb (71.2 kg)  09/27/15 155 lb 9 oz (70.6 kg)    General: Appears his stated age, well developed, well nourished in NAD. HEENT: Throat/Mouth: Teeth missing, receding gum line noted.  Neck: Neck supple, trachea midline. No  masses, lumps or thyromegaly present.  Cardiovascular: Normal rate and rhythm.  Pulmonary/Chest: Normal effort and positive vesicular breath sounds. No respiratory distress. No wheezes, rales or ronchi noted.  Neurological: Alert and oriented.  Psychiatric: Mood and affect normal. Behavior is normal. Judgment and thought content normal.     BMET    Component Value Date/Time   NA 138 09/27/2015 2128   K 3.4 (L) 09/27/2015 2128   CL 104 09/27/2015 2128   CO2 26 09/27/2015 2128   GLUCOSE 132 (H) 09/27/2015 2128   BUN 6 09/27/2015 2128   CREATININE 1.11 09/27/2015 2128   CALCIUM 9.5 09/27/2015 2128   GFRNONAA >60 09/27/2015 2128   GFRAA >60 09/27/2015 2128    Lipid Panel  No results found for: CHOL, TRIG, HDL, CHOLHDL, VLDL, LDLCALC  CBC    Component Value Date/Time   WBC 8.7 09/28/2015 0534   RBC 4.34 09/28/2015 0534   HGB 13.2 09/28/2015 0534   HCT 40.1 09/28/2015 0534   PLT 204 09/28/2015 0534   MCV 92.4 09/28/2015 0534   MCH 30.4 09/28/2015 0534   MCHC 32.9 09/28/2015 0534   RDW 12.1 09/28/2015 0534    Hgb A1C No results found for: HGBA1C   Assessment and Plan:   Chronic Dental Pain:  Needing work under general anesthesia Will check CMET today If normal, will write letter addressing need for general anesthesia  Make an appt for your annual exam Nicki Reaper, NP

## 2017-06-20 ENCOUNTER — Encounter: Payer: Self-pay | Admitting: Internal Medicine

## 2017-06-20 NOTE — Patient Instructions (Signed)
Dental Pain Dental pain may be caused by many things, including:  Tooth decay (cavities or caries). Cavities cause the nerve of your tooth to be open to air and hot or cold temperatures. This can cause pain or discomfort.  Abscess or infection. A dental abscess is an area that is full of infected pus from a bacterial infection in the inner part of the tooth (pulp). It usually happens at the end of the tooth's root.  Injury.  An unknown reason (idiopathic).  Your pain may be mild or severe. It may only happen when:  You are chewing.  You are exposed to hot or cold temperature.  You are eating or drinking sugary foods or beverages, such as: ? Soda. ? Candy.  Your pain may also be there all of the time. Follow these instructions at home: Watch your dental pain for any changes. Do these things to lessen your discomfort:  Take medicines only as told by your dentist.  If your dentist tells you to take an antibiotic medicine, finish all of it even if you start to feel better.  Keep all follow-up visits as told by your dentist. This is important.  Do not apply heat to the outside of your face.  Rinse your mouth or gargle with salt water if told by your dentist. This helps with pain and swelling. ? You can make salt water by adding  tsp of salt to 1 cup of warm water.  Apply ice to the painful area of your face: ? Put ice in a plastic bag. ? Place a towel between your skin and the bag. ? Leave the ice on for 20 minutes, 2-3 times per day.  Avoid foods or drinks that cause you pain, such as: ? Very hot or very cold foods or drinks. ? Sweet or sugary foods or drinks.  Contact a doctor if:  Your pain is not helped with medicines.  Your symptoms are worse.  You have new symptoms. Get help right away if:  You cannot open your mouth.  You are having trouble breathing or swallowing.  You have a fever.  Your face, neck, or jaw is puffy (swollen). This information is not  intended to replace advice given to you by your health care provider. Make sure you discuss any questions you have with your health care provider. Document Released: 08/02/2007 Document Revised: 07/22/2015 Document Reviewed: 02/09/2014 Elsevier Interactive Patient Education  2018 Elsevier Inc.  

## 2018-10-26 ENCOUNTER — Other Ambulatory Visit: Payer: Self-pay

## 2018-10-26 ENCOUNTER — Ambulatory Visit
Admission: EM | Admit: 2018-10-26 | Discharge: 2018-10-26 | Disposition: A | Discharge status: Home / Self Care | Payer: 59 | Attending: Emergency Medicine | Admitting: Emergency Medicine

## 2018-10-26 ENCOUNTER — Encounter: Payer: Self-pay | Admitting: Emergency Medicine

## 2018-10-26 ENCOUNTER — Ambulatory Visit (INDEPENDENT_AMBULATORY_CARE_PROVIDER_SITE_OTHER): Payer: 59

## 2018-10-26 DIAGNOSIS — M79641 Pain in right hand: Secondary | ICD-10-CM | POA: Diagnosis not present

## 2018-10-26 NOTE — Discharge Instructions (Signed)
May ice, rest, elevate the area(s) of pain.  You may also use hot compresses/warm wash rags to relieve muscle tightness. °May use OTC Tylenol, ibuprofen as needed for pain. °Return if you develop worsening pain, chest pain, difficulty breathing. °
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
## 2019-01-17 ENCOUNTER — Other Ambulatory Visit: Payer: Self-pay

## 2019-01-17 ENCOUNTER — Ambulatory Visit
Admission: EM | Admit: 2019-01-17 | Discharge: 2019-01-17 | Disposition: A | Discharge status: Home / Self Care | Payer: 59 | Attending: Physician Assistant | Admitting: Physician Assistant

## 2019-01-17 DIAGNOSIS — R519 Headache, unspecified: Secondary | ICD-10-CM

## 2019-01-17 DIAGNOSIS — Z20822 Contact with and (suspected) exposure to covid-19: Secondary | ICD-10-CM

## 2019-01-17 DIAGNOSIS — Z20828 Contact with and (suspected) exposure to other viral communicable diseases: Secondary | ICD-10-CM | POA: Diagnosis not present

## 2019-01-17 NOTE — ED Provider Notes (Signed)
[facility]    CSN: [id number] Arrival date & time: 01/17/19  0901      History   Chief Complaint Chief Complaint  Patient presents with  . Headache    HPI Mark Bright is a 38 y.o. male.   38 year old male comes in for 2 day of headache with positive COVID exposure. Headache is frontal, mild, has not needed medications. Denies URI symptoms such as cough, congestion, sore throat. Denies fever, chills, body aches. Denies abdominal pain, nausea, vomiting, diarrhea. Denies shortness of breath, loss of taste/smell. Former smoker. Wife tested positive for COVID yesterday, now quarantined in a room.      History reviewed. No pertinent past medical history.  Patient Active Problem List   Diagnosis Date Noted  . Poor dentition 04/17/2016    Past Surgical History:  Procedure Laterality Date  . NO PAST SURGERIES         Home Medications    Prior to Admission medications   Not on File    Family History Family History  Problem Relation Age of Onset  . COPD Mother   . Arthritis Mother   . Hypertension Mother   . Diabetes Brother     Social History Social History   Tobacco Use  . Smoking status: Former Smoker    Packs/day: 0.50  . Smokeless tobacco: Never Used  Substance Use Topics  . Alcohol use: No  . Drug use: No     Allergies   Penicillins   Review of Systems Review of Systems  Reason unable to perform ROS: See HPI as above.     Physical Exam Triage Vital Signs ED Triage Vitals  Enc Vitals Group     BP 01/17/19 0929 108/69     Pulse Rate 01/17/19 0929 61     Resp 01/17/19 0929 18     Temp 01/17/19 0929 97.6 F (36.4 C)     Temp Source 01/17/19 0929 Oral     SpO2 01/17/19 0929 98 %     Weight --      Height --      Head Circumference --      Peak Flow --      Pain Score 01/17/19 0930 3     Pain Loc --      Pain Edu? --      Excl. in [place]? --    No data found.  Updated Vital Signs BP 108/69 (BP Location: Left Arm)    Pulse 61   Temp 97.6 F (36.4 C) (Oral)   Resp 18   SpO2 98%   Physical Exam Constitutional:      General: He is not in acute distress.    Appearance: Normal appearance. He is not ill-appearing, toxic-appearing or diaphoretic.  HENT:     Head: Normocephalic and atraumatic.     Mouth/Throat:     Mouth: Mucous membranes are moist.     Pharynx: Oropharynx is clear. Uvula midline.  Neck:     Musculoskeletal: Normal range of motion and neck supple.  Cardiovascular:     Rate and Rhythm: Normal rate and regular rhythm.     Heart sounds: Normal heart sounds. No murmur. No friction rub. No gallop.   Pulmonary:     Effort: Pulmonary effort is normal. No accessory muscle usage, prolonged expiration, respiratory distress or retractions.     Comments: Lungs clear to auscultation without adventitious lung sounds. Neurological:     General: No focal deficit present.  Mental Status: He is alert and oriented to person, place, and time.      UC Treatments / Results  Labs (all labs ordered are listed, but only abnormal results are displayed) Labs Reviewed  NOVEL CORONAVIRUS, NAA    EKG   Radiology No results found.  Procedures Procedures (including critical care time)  Medications Ordered in UC Medications - No data to display  Initial Impression / Assessment and Plan / UC Course  I have reviewed the triage vital signs and the nursing notes.  Pertinent labs & imaging results that were available during my care of the patient were reviewed by me and considered in my medical decision making (see chart for details).    PCR test ordered. Given patient's exposure, per CDC recommendation, patient will quarantine for 14 days regardless of results.  Patient speaking in full sentences without respiratory distress.  Symptomatic treatment discussed.  Push fluids. Quarantine instructions discussed.  Return precautions given.  Patient expresses understanding and agrees to plan.   Final  Clinical Impressions(s) / UC Diagnoses   Final diagnoses:  Close exposure to COVID-19 virus  Acute intractable headache, unspecified headache type   ED Prescriptions    None     PDMP not reviewed this encounter.   Belinda Fisher, PA-C 01/17/19 [phone]

## 2019-01-17 NOTE — ED Triage Notes (Signed)
Pt c/o headaches for the past 2 days. States his wife is COVID positive.

## 2019-01-17 NOTE — Discharge Instructions (Signed)
COVID testing ordered. As discussed, given your exposure, I would like you to quarantine for 14 days regardless of results. You can take over the counter flonase/nasacort to help with nasal congestion/drainage. If experiencing shortness of breath, trouble breathing, go to the emergency department for further evaluation needed.      

## 2019-01-20 LAB — NOVEL CORONAVIRUS, NAA: SARS-CoV-2, NAA: NOT DETECTED

## 2019-07-17 ENCOUNTER — Other Ambulatory Visit: Payer: Self-pay

## 2019-07-17 ENCOUNTER — Emergency Department (HOSPITAL_COMMUNITY)
Admission: EM | Admit: 2019-07-17 | Discharge: 2019-07-17 | Disposition: A | Discharge status: Home / Self Care | Payer: 59 | Attending: Emergency Medicine | Admitting: Emergency Medicine

## 2019-07-17 ENCOUNTER — Encounter (HOSPITAL_COMMUNITY): Payer: Self-pay | Admitting: *Deleted

## 2019-07-17 DIAGNOSIS — R112 Nausea with vomiting, unspecified: Secondary | ICD-10-CM | POA: Insufficient documentation

## 2019-07-17 DIAGNOSIS — Z5321 Procedure and treatment not carried out due to patient leaving prior to being seen by health care provider: Secondary | ICD-10-CM | POA: Diagnosis not present

## 2019-07-17 DIAGNOSIS — R4182 Altered mental status, unspecified: Secondary | ICD-10-CM | POA: Diagnosis present

## 2019-07-17 LAB — CBC
HCT: 43.5 % (ref 39.0–52.0)
Hemoglobin: 14.6 g/dL (ref 13.0–17.0)
MCH: 32.2 pg (ref 26.0–34.0)
MCHC: 33.6 g/dL (ref 30.0–36.0)
MCV: 95.8 fL (ref 80.0–100.0)
Platelets: 257 10*3/uL (ref 150–400)
RBC: 4.54 MIL/uL (ref 4.22–5.81)
RDW: 12.1 % (ref 11.5–15.5)
WBC: 7.2 10*3/uL (ref 4.0–10.5)
nRBC: 0 % (ref 0.0–0.2)

## 2019-07-17 LAB — BASIC METABOLIC PANEL
Anion gap: 8 (ref 5–15)
BUN: 8 mg/dL (ref 6–20)
CO2: 24 mmol/L (ref 22–32)
Calcium: 8.5 mg/dL — ABNORMAL LOW (ref 8.9–10.3)
Chloride: 106 mmol/L (ref 98–111)
Creatinine, Ser: 1.17 mg/dL (ref 0.61–1.24)
GFR calc Af Amer: >60 mL/min (ref >60)
GFR calc non Af Amer: >60 mL/min (ref >60)
Glucose, Bld: 155 mg/dL — ABNORMAL HIGH (ref 70–99)
Potassium: 3.7 mmol/L (ref 3.5–5.1)
Sodium: 138 mmol/L (ref 135–145)

## 2019-07-17 MED ORDER — SODIUM CHLORIDE 0.9% FLUSH: 3.0000 mL | Freq: Once | INTRAVENOUS | Status: DC

## 2019-07-17 NOTE — ED Triage Notes (Signed)
Pt says that just shortly after receiving his shot he says his arm felt heavy and he started feeling tingly all over. Says he then could hear people but felt like he could not respond. Says when he came too, he did have some dry heaves and nausea. No pain. Alert and oriented.

## 2019-07-17 NOTE — ED Notes (Signed)
Pt left AMA, stated he will return if symptoms worsen. Tech removed IV.

## 2019-07-17 NOTE — ED Triage Notes (Signed)
Pt arrives from the [facility] via [employer]. Pt rec'd the [employer] covid vaccine, they reported 2 minutes later went unresponsive. On arrival by [facility] transport pt was  semi responsive, pressure,  66/42, HR 20's, pt then started vomiting. En route, HR in the  60's, drops down and pt gets woozy. Last BP 108/82, cbg 174. Hr 65, SR. IV established in the left forearm, 500NS given.

## 2020-03-10 ENCOUNTER — Ambulatory Visit (INDEPENDENT_AMBULATORY_CARE_PROVIDER_SITE_OTHER): Payer: 59

## 2020-03-10 ENCOUNTER — Ambulatory Visit
Admission: EM | Admit: 2020-03-10 | Discharge: 2020-03-10 | Disposition: A | Discharge status: Home / Self Care | Payer: 59 | Attending: Emergency Medicine | Admitting: Emergency Medicine

## 2020-03-10 DIAGNOSIS — R0789 Other chest pain: Secondary | ICD-10-CM

## 2020-03-10 LAB — POCT URINALYSIS DIP (MANUAL ENTRY)
Bilirubin, UA: NEGATIVE
Blood, UA: NEGATIVE
Glucose, UA: NEGATIVE mg/dL
Ketones, POC UA: NEGATIVE mg/dL
Leukocytes, UA: NEGATIVE
Nitrite, UA: NEGATIVE
Protein Ur, POC: 30 mg/dL — AB
Spec Grav, UA: 1.025 (ref 1.010–1.025)
Urobilinogen, UA: 0.2 E.U./dL
pH, UA: 6.5 (ref 5.0–8.0)

## 2020-03-10 MED ORDER — DICLOFENAC SODIUM 75 MG PO TBEC: 75.0000 mg | DELAYED_RELEASE_TABLET | Freq: Two times a day (BID) | ORAL | 0 refills | Status: DC
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
## 2020-03-10 NOTE — ED Provider Notes (Signed)
[facility]    CSN: [id number] Arrival date & time: 03/10/20  6812      History   Chief Complaint Chief Complaint  Patient presents with  . Flank Pain    HPI Mark Bright is a 40 y.o. male.   Pt complains of soreness to right chest,  Pt reports no pain with moving.  Pt has pain with sitting.  Pt has a tender area right breast.  Pt feels a knot there   The history is provided by the patient. No language interpreter was used.    History reviewed. No pertinent past medical history.  Patient Active Problem List   Diagnosis Date Noted  . Poor dentition 04/17/2016    Past Surgical History:  Procedure Laterality Date  . NO PAST SURGERIES         Home Medications    Prior to Admission medications   Not on File    Family History Family History  Problem Relation Age of Onset  . COPD Mother   . Arthritis Mother   . Hypertension Mother   . Diabetes Brother     Social History Social History   Tobacco Use  . Smoking status: Former Smoker    Packs/day: 0.50    Types: E-cigarettes  . Smokeless tobacco: Never Used  Substance Use Topics  . Alcohol use: No  . Drug use: No     Allergies   Penicillins   Review of Systems Review of Systems  All other systems reviewed and are negative.    Physical Exam Triage Vital Signs ED Triage Vitals [03/10/20 1004]  Enc Vitals Group     BP 117/78     Pulse Rate 75     Resp 18     Temp 98.1 F (36.7 C)     Temp Source Oral     SpO2 96 %     Weight      Height      Head Circumference      Peak Flow      Pain Score 6     Pain Loc      Pain Edu?      Excl. in [place]?    No data found.  Updated Vital Signs BP 117/78 (BP Location: Left Arm)   Pulse 75   Temp 98.1 F (36.7 C) (Oral)   Resp 18   SpO2 96%   Visual Acuity Right Eye Distance:   Left Eye Distance:   Bilateral Distance:    Right Eye Near:   Left Eye Near:    Bilateral Near:     Physical Exam Vitals and nursing note  reviewed.  Constitutional:      Appearance: He is well-developed and well-nourished.  HENT:     Head: Normocephalic.  Eyes:     Extraocular Movements: EOM normal.  Cardiovascular:     Rate and Rhythm: Normal rate.  Pulmonary:     Effort: Pulmonary effort is normal.  Abdominal:     General: Abdomen is flat. There is no distension.  Musculoskeletal:        General: Normal range of motion.     Cervical back: Normal range of motion.  Skin:    General: Skin is warm.  Neurological:     General: No focal deficit present.     Mental Status: He is alert and oriented to person, place, and time.  Psychiatric:        Mood and Affect: Mood and affect and mood  normal.      UC Treatments / Results  Labs (all labs ordered are listed, but only abnormal results are displayed) Labs Reviewed  POCT URINALYSIS DIP (MANUAL ENTRY) - Abnormal; Notable for the following components:      Result Value   Protein Ur, POC =30 (*)    All other components within normal limits    EKG   Radiology No results found.  Procedures Procedures (including critical care time)  Medications Ordered in UC Medications - No data to display  Initial Impression / Assessment and Plan / UC Course  I have reviewed the triage vital signs and the nursing notes.  Pertinent labs & imaging results that were available during my care of the patient were reviewed by me and considered in my medical decision making (see chart for details).     MDM:  Pt given rx for voltaren.  Pt advised to recheck in 1 week  Final Clinical Impressions(s) / UC Diagnoses   Final diagnoses:  Chest wall pain     Discharge Instructions     Your covid test is pending   ED Prescriptions    Medication Sig Dispense Auth. Provider   diclofenac (VOLTAREN) 75 MG EC tablet Take 1 tablet (75 mg total) by mouth 2 (two) times daily. 20 tablet Elson Areas, [place]     PDMP not reviewed this encounter.  An After Visit Summary was printed  and given to the patient.    Elson Areas, [place] 03/10/20 1100

## 2020-08-26 ENCOUNTER — Other Ambulatory Visit: Payer: Self-pay

## 2020-08-26 ENCOUNTER — Ambulatory Visit
Admission: RE | Admit: 2020-08-26 | Discharge: 2020-08-26 | Disposition: A | Discharge status: Home / Self Care | Payer: 59 | Source: Ambulatory Visit | Attending: Internal Medicine | Admitting: Internal Medicine

## 2020-08-26 VITALS — BP 113/78 | HR 62 | Temp 98.7°F | Resp 16

## 2020-08-26 DIAGNOSIS — R300 Dysuria: Secondary | ICD-10-CM

## 2020-08-26 DIAGNOSIS — R350 Frequency of micturition: Secondary | ICD-10-CM | POA: Diagnosis not present

## 2020-08-26 DIAGNOSIS — R1084 Generalized abdominal pain: Secondary | ICD-10-CM | POA: Insufficient documentation

## 2020-08-26 LAB — POCT URINALYSIS DIP (MANUAL ENTRY)
Bilirubin, UA: NEGATIVE
Blood, UA: NEGATIVE
Glucose, UA: NEGATIVE mg/dL
Ketones, POC UA: NEGATIVE mg/dL
Leukocytes, UA: NEGATIVE
Nitrite, UA: NEGATIVE
Protein Ur, POC: NEGATIVE mg/dL
Spec Grav, UA: 1.015 (ref 1.010–1.025)
Urobilinogen, UA: 0.2 E.U./dL
pH, UA: 6 (ref 5.0–8.0)

## 2020-08-26 MED ORDER — DOXYCYCLINE HYCLATE 100 MG PO CAPS: 100.0000 mg | ORAL_CAPSULE | Freq: Two times a day (BID) | ORAL | 0 refills | Status: AC

## 2020-08-26 MED ORDER — NYSTATIN 100000 UNIT/GM EX POWD: 1.0000 "application " | Freq: Three times a day (TID) | CUTANEOUS | 0 refills | Status: DC

## 2020-08-26 NOTE — ED Triage Notes (Signed)
C/O constant RLQ abd pain onset yesterday AM - states spoke with PCP and took some IBU with some slight improvement.  States today pain "moves" to various areas of abd, but still remains predominantly in RLQ. C/O polyuria and urinary urgency x approx 3 days with "weird sensation" with urination.  Denies penile discharge. Denies any fevers.

## 2020-08-26 NOTE — ED Provider Notes (Signed)
[facility]    CSN: [id number] Arrival date & time: 08/26/20  0944      History   Chief Complaint Chief Complaint  Patient presents with   Abdominal Pain    HPI Mark Bright is a 40 y.o. male.   Patient presents to the urgent care due to right lower abdominal pain that started yesterday and is now radiating throughout entire abdomen.  Patient states that pain has improved since yesterday.  Denies any vomiting but has had some occasional nausea.  Denies any diarrhea.  Last bowel movement was this morning and was normal in consistency.  Denies any blood in stool.  Denies any fevers at home.  Patient endorses urinary frequency and a "weird sensation" when urinating.  Denies any urinary burning.  Denies penile discharge and penile irritation.  Patient states that he did have some right groin pain yesterday that has resolved today. denies any scrotal pain.  Denies any sick contacts.  Denies any risky sexual behavior or exposure to STD.  Patient states that he has 1 sexual partner.   Abdominal Pain  History reviewed. No pertinent past medical history.  Patient Active Problem List   Diagnosis Date Noted   Poor dentition 04/17/2016    Past Surgical History:  Procedure Laterality Date   NO PAST SURGERIES         Home Medications    Prior to Admission medications   Medication Sig Start Date End Date Taking? Authorizing Provider  doxycycline (VIBRAMYCIN) 100 MG capsule Take 1 capsule (100 mg total) by mouth 2 (two) times daily for 10 days. 08/26/20 09/05/20 Yes Lance Muss, FNP  IBUPROFEN PO Take by mouth.   Yes [provider]  nystatin powder Apply 1 application topically 3 (three) times daily. 08/26/20  Yes Lance Muss, FNP  diclofenac (VOLTAREN) 75 MG EC tablet Take 1 tablet (75 mg total) by mouth 2 (two) times daily. 03/10/20   Elson Areas, PA-C    Family History Family History  Problem Relation Age of Onset   COPD Mother    Arthritis  Mother    Hypertension Mother    Diabetes Brother     Social History Social History   Tobacco Use   Smoking status: Former    Packs/day: 0.50    Pack years: 0.00    Types: E-cigarettes, Cigarettes   Smokeless tobacco: Never  Vaping Use   Vaping Use: Every day  Substance Use Topics   Alcohol use: No   Drug use: No     Allergies   Novocain [procaine] and Penicillins   Review of Systems Review of Systems Per HPI  Physical Exam Triage Vital Signs ED Triage Vitals  Enc Vitals Group     BP 08/26/20 1010 113/78     Pulse Rate 08/26/20 1010 62     Resp 08/26/20 1010 16     Temp 08/26/20 1010 98.7 F (37.1 C)     Temp Source 08/26/20 1010 Temporal     SpO2 08/26/20 1010 99 %     Weight --      Height --      Head Circumference --      Peak Flow --      Pain Score 08/26/20 1012 4     Pain Loc --      Pain Edu? --      Excl. in GC? --    No data found.  Updated Vital Signs BP 113/78   Pulse  62   Temp 98.7 F (37.1 C) (Temporal)   Resp 16   SpO2 99%   Visual Acuity Right Eye Distance:   Left Eye Distance:   Bilateral Distance:    Right Eye Near:   Left Eye Near:    Bilateral Near:     Physical Exam Exam conducted with a chaperone present.  Constitutional:      General: He is not in acute distress.    Appearance: Normal appearance.  HENT:     Head: Normocephalic and atraumatic.  Eyes:     Extraocular Movements: Extraocular movements intact.     Conjunctiva/sclera: Conjunctivae normal.  Cardiovascular:     Rate and Rhythm: Normal rate and regular rhythm.     Heart sounds: Normal heart sounds. No murmur heard. Pulmonary:     Effort: Pulmonary effort is normal. No respiratory distress.     Breath sounds: Normal breath sounds. No wheezing.  Abdominal:     General: Abdomen is flat. Bowel sounds are normal. There is no distension.     Palpations: Abdomen is soft. There is no hepatomegaly or mass.     Tenderness: There is no abdominal tenderness.      Hernia: There is no hernia in the left inguinal area or right inguinal area.     Comments: No abdominal pain on palpation.  Negative at McBurney's point.  No rebound tenderness or guarding at right lower quadrant or any abdominal areas.  Genitourinary:    Pubic Area: Rash present.     Penis: Erythema present. No tenderness, discharge, swelling or lesions.      Testes: Normal. Cremasteric reflex is present.        Right: Mass, tenderness or swelling not present.        Left: Mass, tenderness or swelling not present.     Epididymis:     Right: Normal.     Left: Normal.     Comments: Bright red rash to right groin.  Seems fungal in nature.  Self swab performed. Lymphadenopathy:     Lower Body: No right inguinal adenopathy. No left inguinal adenopathy.  Skin:    General: Skin is warm and dry.     Findings: Erythema and rash present.     Comments: Bright red rash to right groin.  Neurological:     General: No focal deficit present.     Mental Status: He is alert and oriented to person, place, and time. Mental status is at baseline.  Psychiatric:        Mood and Affect: Mood normal.        Behavior: Behavior normal.        Thought Content: Thought content normal.        Judgment: Judgment normal.      UC Treatments / Results  Labs (all labs ordered are listed, but only abnormal results are displayed) Labs Reviewed  URINE CULTURE  POCT URINALYSIS DIP (MANUAL ENTRY)  CYTOLOGY, (ORAL, ANAL, URETHRAL) ANCILLARY ONLY    EKG   Radiology No results found.  Procedures Procedures (including critical care time)  Medications Ordered in UC Medications - No data to display  Initial Impression / Assessment and Plan / UC Course  I have reviewed the triage vital signs and the nursing notes.  Pertinent labs & imaging results that were available during my care of the patient were reviewed by me and considered in my medical decision making (see chart for details).     Urinalysis  negative for urinary  tract infection.  Urine culture pending.  STD test for gonorrhea and chlamydia pending.  Negative for inguinal hernias or epididymitis on exam.  Will treat with doxycycline antibiotic due to urinary frequency and dysuria to cover multiple etiologies for possible urinary tract infection, or epididymitis, or possible STD.  Patient was advised to go to the hospital emergency department if abdominal pain, groin pain, other symptoms worsen.  Patient voiced understanding.  Patient declined any medication for nausea.  Nystatin powder sent for right candidal infection to groin. discussed strict return precautions. Patient verbalized understanding and is agreeable with plan.  Final Clinical Impressions(s) / UC Diagnoses   Final diagnoses:  Dysuria  Urinary frequency  Generalized abdominal pain     Discharge Instructions      Urine culture and swab for STD testing are pending.  We will call if results are positive and treat appropriately.  You are being treated today with doxycycline antibiotic to cover possible multiple organisms that could be causing symptoms.  Please take antibiotic with food to avoid nausea and stomach upset.  Please go to the hospital if abdominal pain worsens or if symptoms worsen.  Monitor fevers.     ED Prescriptions     Medication Sig Dispense Auth. Provider   doxycycline (VIBRAMYCIN) 100 MG capsule Take 1 capsule (100 mg total) by mouth 2 (two) times daily for 10 days. 20 capsule Henrene Dodge E, FNP   nystatin powder Apply 1 application topically 3 (three) times daily. 15 g Lance Muss, FNP      PDMP not reviewed this encounter.   Lance Muss, FNP 08/26/20 1128

## 2020-08-26 NOTE — Discharge Instructions (Signed)
Urine culture and swab for STD testing are pending.  We will call if results are positive and treat appropriately.  You are being treated today with doxycycline antibiotic to cover possible multiple organisms that could be causing symptoms.  Please take antibiotic with food to avoid nausea and stomach upset.  Please go to the hospital if abdominal pain worsens or if symptoms worsen.  Monitor fevers.

## 2020-08-27 LAB — CYTOLOGY, (ORAL, ANAL, URETHRAL) ANCILLARY ONLY
Chlamydia: NEGATIVE
Comment: NEGATIVE
Comment: NEGATIVE
Comment: NORMAL
Neisseria Gonorrhea: NEGATIVE
Trichomonas: NEGATIVE

## 2020-08-28 LAB — URINE CULTURE: Culture: NO GROWTH

## 2020-09-06 ENCOUNTER — Other Ambulatory Visit: Payer: Self-pay

## 2020-09-06 ENCOUNTER — Ambulatory Visit
Admission: EM | Admit: 2020-09-06 | Discharge: 2020-09-06 | Disposition: A | Discharge status: Home / Self Care | Payer: 59 | Attending: Emergency Medicine | Admitting: Emergency Medicine

## 2020-09-06 ENCOUNTER — Inpatient Hospital Stay
Admission: RE | Admit: 2020-09-06 | Discharge: 2020-09-06 | Disposition: A | Discharge status: Home / Self Care | Payer: 59 | Source: Ambulatory Visit

## 2020-09-06 ENCOUNTER — Encounter: Payer: Self-pay | Admitting: Emergency Medicine

## 2020-09-06 DIAGNOSIS — R1031 Right lower quadrant pain: Secondary | ICD-10-CM | POA: Diagnosis not present

## 2020-09-06 LAB — POCT URINALYSIS DIP (MANUAL ENTRY)
Bilirubin, UA: NEGATIVE
Blood, UA: NEGATIVE
Glucose, UA: NEGATIVE mg/dL
Ketones, POC UA: NEGATIVE mg/dL
Leukocytes, UA: NEGATIVE
Nitrite, UA: NEGATIVE
Protein Ur, POC: NEGATIVE mg/dL
Spec Grav, UA: 1.02 (ref 1.010–1.025)
Urobilinogen, UA: 0.2 E.U./dL
pH, UA: 5.5 (ref 5.0–8.0)

## 2020-09-06 MED ORDER — ALUM & MAG HYDROXIDE-SIMETH 400-400-40 MG/5ML PO SUSP: 10.0000 mL | Freq: Four times a day (QID) | ORAL | 0 refills | Status: DC | PRN

## 2020-09-06 MED ORDER — DOCUSATE SODIUM 100 MG PO CAPS
100.0000 mg | ORAL_CAPSULE | Freq: Two times a day (BID) | ORAL | 0 refills | Status: DC
Start: 2020-09-06 — End: 2021-04-21

## 2020-09-06 MED ORDER — POLYETHYLENE GLYCOL 3350 17 G PO PACK: 17.0000 g | PACK | Freq: Every day | ORAL | 0 refills | Status: DC

## 2020-09-06 MED ORDER — DOCUSATE SODIUM 100 MG PO CAPS
100.0000 mg | ORAL_CAPSULE | Freq: Two times a day (BID) | ORAL | 0 refills | Status: DC
Start: 2020-09-06 — End: 2020-09-06

## 2020-09-06 NOTE — ED Provider Notes (Signed)
[facility]    CSN: [id number] Arrival date & time: 09/06/20  1351      History   Chief Complaint Chief Complaint  Patient presents with   Abdominal Pain    HPI Mark Bright is a 40 y.o. male presenting today for evaluation of abdominal pain.  Reports that he has had right lower quadrant abdominal pain for approximately 2 weeks.  Pain is described as a dull constant aching sensation.  Reports slightly worsened over the past couple days.  This morning symptoms were radiating into his upper abdomen with some slight nausea.  Overall has had minimal nausea no vomiting.  Reports his bowels also have become slightly smaller and more frequent around the similar time period.  Stools slightly softer.  Denies blood in stool.  Denies any significant changes with passing bowels.  Denies any changes in symptoms with oral intake.  Was seen approximately 1.5 weeks ago and at the time was also having groin pain, urine was unremarkable, was started on doxycycline as well as given nystatin powder for rash in groin.  Reports that pelvic/groin pain has improved, denies any symptoms radiating into groin.  Denies urinary symptoms of hematuria frequency or urgency.  Denies history of stones.  Denies history of any prior GI problems.  HPI  History reviewed. No pertinent past medical history.  Patient Active Problem List   Diagnosis Date Noted   Poor dentition 04/17/2016    Past Surgical History:  Procedure Laterality Date   NO PAST SURGERIES         Home Medications    Prior to Admission medications   Medication Sig Start Date End Date Taking? Authorizing Provider  alum & mag hydroxide-simeth (MAALOX MAX) 400-400-40 MG/5ML suspension Take 10 mLs by mouth every 6 (six) hours as needed for indigestion. 09/06/20  Yes Samera Macy C, PA-C  polyethylene glycol (MIRALAX / GLYCOLAX) 17 g packet Take 17 g by mouth daily. 09/06/20  Yes Sherolyn Trettin C, PA-C  diclofenac (VOLTAREN) 75 MG EC  tablet Take 1 tablet (75 mg total) by mouth 2 (two) times daily. 03/10/20   Elson Areas, PA-C  docusate sodium (COLACE) 100 MG capsule Take 1 capsule (100 mg total) by mouth every 12 (twelve) hours. 09/06/20   Plummer Matich C, PA-C  IBUPROFEN PO Take by mouth.    [provider]  nystatin powder Apply 1 application topically 3 (three) times daily. 08/26/20   Lance Muss, FNP    Family History Family History  Problem Relation Age of Onset   COPD Mother    Arthritis Mother    Hypertension Mother    Diabetes Brother     Social History Social History   Tobacco Use   Smoking status: Former    Packs/day: 0.50    Pack years: 0.00    Types: E-cigarettes, Cigarettes   Smokeless tobacco: Never  Vaping Use   Vaping Use: Every day  Substance Use Topics   Alcohol use: No   Drug use: No     Allergies   Novocain [procaine] and Penicillins   Review of Systems Review of Systems  Constitutional:  Negative for fever.  HENT:  Negative for sore throat.   Respiratory:  Negative for shortness of breath.   Cardiovascular:  Negative for chest pain.  Gastrointestinal:  Positive for abdominal pain and nausea. Negative for vomiting.  Genitourinary:  Negative for difficulty urinating, dysuria, frequency, penile discharge, penile pain, penile swelling, scrotal swelling and testicular pain.  Skin:  Negative for rash.  Neurological:  Negative for dizziness, light-headedness and headaches.    Physical Exam Triage Vital Signs ED Triage Vitals  Enc Vitals Group     BP      Pulse      Resp      Temp      Temp src      SpO2      Weight      Height      Head Circumference      Peak Flow      Pain Score      Pain Loc      Pain Edu?      Excl. in GC?    No data found.  Updated Vital Signs BP 125/81 (BP Location: Right Arm)   Pulse 68   Temp 98.7 F (37.1 C) (Oral)   Resp 18   SpO2 98%   Visual Acuity Right Eye Distance:   Left Eye Distance:   Bilateral  Distance:    Right Eye Near:   Left Eye Near:    Bilateral Near:     Physical Exam Vitals and nursing note reviewed.  Constitutional:      Appearance: He is well-developed.     Comments: No acute distress  HENT:     Head: Normocephalic and atraumatic.     Nose: Nose normal.     Mouth/Throat:     Comments: Oral mucosa pink and moist, no tonsillar enlargement or exudate. Posterior pharynx patent and nonerythematous, no uvula deviation or swelling. Normal phonation.   Eyes:     Conjunctiva/sclera: Conjunctivae normal.  Cardiovascular:     Rate and Rhythm: Normal rate.  Pulmonary:     Effort: Pulmonary effort is normal. No respiratory distress.     Comments: Breathing comfortably at rest, CTABL, no wheezing, rales or other adventitious sounds auscultated Abdominal:     General: There is no distension.     Comments: Soft, nondistended, tenderness to palpation in right lower quadrant, negative rebound, negative Rovsing, negative McBurney's  Musculoskeletal:        General: Normal range of motion.     Cervical back: Neck supple.  Skin:    General: Skin is warm and dry.  Neurological:     Mental Status: He is alert and oriented to person, place, and time.     UC Treatments / Results  Labs (all labs ordered are listed, but only abnormal results are displayed) Labs Reviewed  CBC WITH DIFFERENTIAL/PLATELET  COMPREHENSIVE METABOLIC PANEL  LIPASE  POCT URINALYSIS DIP (MANUAL ENTRY)    EKG   Radiology No results found.  Procedures Procedures (including critical care time)  Medications Ordered in UC Medications - No data to display  Initial Impression / Assessment and Plan / UC Course  I have reviewed the triage vital signs and the nursing notes.  Pertinent labs & imaging results that were available during my care of the patient were reviewed by me and considered in my medical decision making (see chart for details).     Right lower quadrant abdominal pain-urine  continues to be unremarkable for any hemoglobin, low suspicion of underlying stone, more suspicious of associated constipation given reported change in bowels recently.  Will check basic labs given symptoms x2 weeks to include CBC, CMP and lipase.  Discussed recommendations of use of MiraLAX and Colace, Maalox for any underlying indigestion, referral placed to gastroenterology for further follow-up if pain continuing without relief of moving bowels more frequently.  Discussed strict return precautions. Patient verbalized understanding and is agreeable with plan.  Final Clinical Impressions(s) / UC Diagnoses   Final diagnoses:  Right lower quadrant abdominal pain     Discharge Instructions      Please use Miralax for moderate to severe constipation. Take this once a day for the next 2-3 days. Please also start docusate stool softener, twice a day for at least 1 week. If stools become loose, cut down to once a day for another week. If stools remain loose, cut back to 1 pill every other day for a third week. You can stop docusate thereafter and resume as needed for constipation.  Maalox to help with indigestion/gas  Referral placed to gastroenterology if symptoms persisiting  To help reduce constipation and promote bowel health: 1. Drink at least 64 ounces of water each day 2. Eat plenty of fiber (fruits, vegetables, whole grains, legumes) 3. Be physically active or exercise including walking, jogging, swimming, yoga, etc. 4. For active constipation use a stool softener (docusate) or an osmotic laxative (like Miralax) each day, or as needed.     ED Prescriptions     Medication Sig Dispense Auth. Provider   polyethylene glycol (MIRALAX / GLYCOLAX) 17 g packet Take 17 g by mouth daily. 14 each Holleigh Crihfield C, PA-C   docusate sodium (COLACE) 100 MG capsule  (Status: Discontinued) Take 1 capsule (100 mg total) by mouth every 12 (twelve) hours. 60 capsule Swetha Rayle C, PA-C   alum &  mag hydroxide-simeth (MAALOX MAX) 400-400-40 MG/5ML suspension Take 10 mLs by mouth every 6 (six) hours as needed for indigestion. 355 mL Tocara Mennen C, PA-C   docusate sodium (COLACE) 100 MG capsule Take 1 capsule (100 mg total) by mouth every 12 (twelve) hours. 20 capsule Nattalie Santiesteban, [place] C, PA-C      PDMP not reviewed this encounter.   Lew Dawes, [place] 09/06/20 1458

## 2020-09-06 NOTE — ED Triage Notes (Signed)
Patient presents to [facility] for evaluation of continued abdominal pain, radiating from RLQ to back and to epigastric area.  Patient c/o nausea this morning when pain was more severe.  Patient denies at this time.

## 2020-09-06 NOTE — Discharge Instructions (Addendum)
Please use Miralax for moderate to severe constipation. Take this once a day for the next 2-3 days. Please also start docusate stool softener, twice a day for at least 1 week. If stools become loose, cut down to once a day for another week. If stools remain loose, cut back to 1 pill every other day for a third week. You can stop docusate thereafter and resume as needed for constipation.  Maalox to help with indigestion/gas  Referral placed to gastroenterology if symptoms persisiting  To help reduce constipation and promote bowel health: 1. Drink at least 64 ounces of water each day 2. Eat plenty of fiber (fruits, vegetables, whole grains, legumes) 3. Be physically active or exercise including walking, jogging, swimming, yoga, etc. 4. For active constipation use a stool softener (docusate) or an osmotic laxative (like Miralax) each day, or as needed.

## 2020-09-09 ENCOUNTER — Encounter: Payer: Self-pay | Admitting: Physician Assistant

## 2020-09-09 LAB — COMPREHENSIVE METABOLIC PANEL
ALT: 21 IU/L (ref 0–44)
AST: 23 IU/L (ref 0–40)
Albumin/Globulin Ratio: 2.3 — ABNORMAL HIGH (ref 1.2–2.2)
Albumin: 5.1 g/dL — ABNORMAL HIGH (ref 4.0–5.0)
Alkaline Phosphatase: 87 IU/L (ref 44–121)
BUN/Creatinine Ratio: 11 (ref 9–20)
BUN: 12 mg/dL (ref 6–24)
Bilirubin Total: 0.8 mg/dL (ref 0.0–1.2)
CO2: 16 mmol/L — ABNORMAL LOW (ref 20–29)
Calcium: 9.3 mg/dL (ref 8.7–10.2)
Chloride: 99 mmol/L (ref 96–106)
Creatinine, Ser: 1.05 mg/dL (ref 0.76–1.27)
Globulin, Total: 2.2 g/dL (ref 1.5–4.5)
Sodium: 141 mmol/L (ref 134–144)
Total Protein: 7.3 g/dL (ref 6.0–8.5)
eGFR: 91 mL/min/{1.73_m2} (ref >59)

## 2020-09-09 LAB — CBC WITH DIFFERENTIAL/PLATELET
Basophils Absolute: 0.1 10*3/uL (ref 0.0–0.2)
Basos: 1 %
EOS (ABSOLUTE): 0.3 10*3/uL (ref 0.0–0.4)
Eos: 3 %
Hematocrit: 47.3 % (ref 37.5–51.0)
Hemoglobin: 16.1 g/dL (ref 13.0–17.7)
Immature Grans (Abs): 0 10*3/uL (ref 0.0–0.1)
Immature Granulocytes: 0 %
Lymphocytes Absolute: 1.5 10*3/uL (ref 0.7–3.1)
Lymphs: 16 %
MCH: 30.8 pg (ref 26.6–33.0)
MCHC: 34 g/dL (ref 31.5–35.7)
MCV: 90 fL (ref 79–97)
Monocytes Absolute: 0.6 10*3/uL (ref 0.1–0.9)
Monocytes: 6 %
Neutrophils Absolute: 7.1 10*3/uL — ABNORMAL HIGH (ref 1.4–7.0)
Neutrophils: 74 %
Platelets: 270 10*3/uL (ref 150–450)
RBC: 5.23 x10E6/uL (ref 4.14–5.80)
RDW: 11.9 % (ref 11.6–15.4)
WBC: 9.6 10*3/uL (ref 3.4–10.8)

## 2020-09-09 LAB — LIPASE: Lipase: 61 U/L (ref 13–78)

## 2020-09-22 ENCOUNTER — Telehealth: Payer: Self-pay | Admitting: Emergency Medicine

## 2020-09-22 ENCOUNTER — Ambulatory Visit: Payer: 59

## 2020-09-22 MED ORDER — IBUPROFEN 800 MG PO TABS
800.0000 mg | ORAL_TABLET | Freq: Three times a day (TID) | ORAL | 0 refills | Status: DC
Start: 2020-09-22 — End: 2021-04-21

## 2020-09-22 MED ORDER — DOXYCYCLINE HYCLATE 100 MG PO CAPS: 100.0000 mg | ORAL_CAPSULE | Freq: Two times a day (BID) | ORAL | 0 refills | Status: AC

## 2020-09-22 NOTE — Telephone Encounter (Signed)
Patient called to discuss recent symptoms since his past 2 visits.  Reports persistent and worsening abdominal pain but since has also developed fevers, arthralgias, myalgias, rash.  Reports rash began on trunk and is hive-like in nature.  He does report recent tick bites and with all of these new symptoms he is concerned about [facility] spotted fever.  Reports associated hot and cold chills, poor sleep.  At initial visit on 6/30 he was given a course of doxycycline for possible urethritis, took 2 days of this and then stopped due to negative results.  He reports taking 1 dose of doxycycline last night and feels his symptoms are much improved.  COVID test negative.  Patient is wondering if he should continue to take doxycycline.  Discussed options with patient of coming in for official visit for blood testing for RMSF versus continuing course of doxycycline.  Discussed continuing doxycycline until symptoms resolved if truly RMSF.  Patient will continue on doxycycline he has leftover from prior visit with close monitoring of symptoms-5 additional days added onto course, if not improving he will follow-up within 2 to 3 days.  He also has plans to follow-up with gastroenterology on 8/11 for his persistent abdominal pain as well.Discussed strict return precautions. Patient verbalized understanding and is agreeable with plan.

## 2020-10-07 ENCOUNTER — Ambulatory Visit: Payer: 59 | Admitting: Physician Assistant

## 2020-12-23 ENCOUNTER — Emergency Department (HOSPITAL_BASED_OUTPATIENT_CLINIC_OR_DEPARTMENT_OTHER): Payer: 59 | Admitting: Radiology

## 2020-12-23 ENCOUNTER — Other Ambulatory Visit: Payer: Self-pay

## 2020-12-23 ENCOUNTER — Emergency Department (HOSPITAL_BASED_OUTPATIENT_CLINIC_OR_DEPARTMENT_OTHER)
Admission: EM | Admit: 2020-12-23 | Discharge: 2020-12-23 | Disposition: A | Discharge status: Home / Self Care | Payer: 59 | Attending: Emergency Medicine | Admitting: Emergency Medicine

## 2020-12-23 ENCOUNTER — Ambulatory Visit: Payer: 59

## 2020-12-23 ENCOUNTER — Encounter (HOSPITAL_BASED_OUTPATIENT_CLINIC_OR_DEPARTMENT_OTHER): Payer: Self-pay | Admitting: Obstetrics and Gynecology

## 2020-12-23 DIAGNOSIS — R079 Chest pain, unspecified: Secondary | ICD-10-CM | POA: Diagnosis present

## 2020-12-23 DIAGNOSIS — Z87891 Personal history of nicotine dependence: Secondary | ICD-10-CM | POA: Insufficient documentation

## 2020-12-23 DIAGNOSIS — R0789 Other chest pain: Secondary | ICD-10-CM | POA: Diagnosis not present

## 2020-12-23 LAB — CBC
HCT: 47.8 % (ref 39.0–52.0)
Hemoglobin: 16.4 g/dL (ref 13.0–17.0)
MCH: 30.9 pg (ref 26.0–34.0)
MCHC: 34.3 g/dL (ref 30.0–36.0)
MCV: 90 fL (ref 80.0–100.0)
Platelets: 268 10*3/uL (ref 150–400)
RBC: 5.31 MIL/uL (ref 4.22–5.81)
RDW: 11.9 % (ref 11.5–15.5)
WBC: 6.5 10*3/uL (ref 4.0–10.5)
nRBC: 0 % (ref 0.0–0.2)

## 2020-12-23 LAB — TROPONIN I (HIGH SENSITIVITY)
Troponin I (High Sensitivity): <2 ng/L (ref <18)
Troponin I (High Sensitivity): <2 ng/L (ref <18)

## 2020-12-23 LAB — BASIC METABOLIC PANEL
Anion gap: 13 (ref 5–15)
BUN: 12 mg/dL (ref 6–20)
CO2: 22 mmol/L (ref 22–32)
Calcium: 9.2 mg/dL (ref 8.9–10.3)
Chloride: 103 mmol/L (ref 98–111)
Creatinine, Ser: 1.09 mg/dL (ref 0.61–1.24)
GFR, Estimated: >60 mL/min (ref >60)
Glucose, Bld: 101 mg/dL — ABNORMAL HIGH (ref 70–99)
Potassium: 3.9 mmol/L (ref 3.5–5.1)
Sodium: 138 mmol/L (ref 135–145)

## 2020-12-23 MED ORDER — LIDOCAINE VISCOUS HCL 2 % MT SOLN
15.0000 mL | Freq: Once | OROMUCOSAL | Status: AC
  Administered 2020-12-23: 15 mL via ORAL
  Filled 2020-12-23: qty 15

## 2020-12-23 MED ORDER — PANTOPRAZOLE SODIUM 20 MG PO TBEC: 20.0000 mg | DELAYED_RELEASE_TABLET | Freq: Every day | ORAL | 0 refills | Status: DC

## 2020-12-23 MED ORDER — ALUM & MAG HYDROXIDE-SIMETH 200-200-20 MG/5ML PO SUSP
30.0000 mL | Freq: Once | ORAL | Status: AC
  Administered 2020-12-23: 30 mL via ORAL
  Filled 2020-12-23: qty 30

## 2020-12-23 NOTE — Discharge Instructions (Addendum)
Take Protonix daily for the next week to decrease symptoms. I recommend you follow-up with the GI doctor you are planning to for further evaluation.  Return to the emergency room if you develop any new, worsening, or concerning symptoms

## 2020-12-23 NOTE — ED Provider Notes (Signed)
Emergency Medicine Provider Triage Evaluation Note  Mark Bright , a 40 y.o. male  was evaluated in triage.  Pt complains of chest pain for 4 days.  Pain is intermittent.  Occasionally last a few seconds, last night lasted for a few hours.  Pain is worsening.  Not improved with Gas-X.  No associated shortness of breath, diaphoresis, nausea or vomiting.  Is not worse with exertion.  Is not worse with inspiration.  No other medical problems  Review of Systems  Positive: cp Negative: sob  Physical Exam  BP (!) 144/81   Pulse 62   Temp 98.5 F (36.9 C)   Resp 16   SpO2 100%  Gen:   Awake, no distress   Resp:  Normal effort  MSK:   Moves extremities without difficulty    Medical Decision Making  Medically screening exam initiated at 12:51 PM.  Appropriate orders placed.  Mark Bright was informed that the remainder of the evaluation will be completed by another provider, this initial triage assessment does not replace that evaluation, and the importance of remaining in the ED until their evaluation is complete.  Labs, ekg, cxr   [name], PA-C 12/23/20 1252    Linwood Dibbles, MD 12/23/20 [phone]

## 2020-12-23 NOTE — ED Provider Notes (Signed)
[facility] EMERGENCY DEPT Provider Note   CSN: [id number] Arrival date & time: 12/23/20  1240     History Chief Complaint  Patient presents with   Chest Pain    Mark Bright is a 40 y.o. male presenting for evaluation of chest pain.  Patient states he has had chest pain intermittently for the past week.  Initially was mild, but now becoming more severe.  He has associated burping.  He thought it was gas, has been taking gasoline without improvement of symptoms.  He denies associated shortness of breath, nausea, diaphoresis.  No previous cardiac problems.  He takes no medications daily.  He has not taken anything for his symptoms today  HPI     History reviewed. No pertinent past medical history.  Patient Active Problem List   Diagnosis Date Noted   Poor dentition 04/17/2016    Past Surgical History:  Procedure Laterality Date   NO PAST SURGERIES         Family History  Problem Relation Age of Onset   COPD Mother    Arthritis Mother    Hypertension Mother    Diabetes Brother     Social History   Tobacco Use   Smoking status: Former    Packs/day: 0.50    Types: E-cigarettes, Cigarettes   Smokeless tobacco: Never  Vaping Use   Vaping Use: Every day   Substances: Flavoring  Substance Use Topics   Alcohol use: No   Drug use: No    Home Medications Prior to Admission medications   Medication Sig Start Date End Date Taking? Authorizing Provider  pantoprazole (PROTONIX) 20 MG tablet Take 1 tablet (20 mg total) by mouth daily. 12/23/20  Yes Pearce Littlefield, PA-C  alum & mag hydroxide-simeth (MAALOX MAX) 400-400-40 MG/5ML suspension Take 10 mLs by mouth every 6 (six) hours as needed for indigestion. 09/06/20   Wieters, Hallie C, PA-C  docusate sodium (COLACE) 100 MG capsule Take 1 capsule (100 mg total) by mouth every 12 (twelve) hours. 09/06/20   Wieters, Hallie C, PA-C  ibuprofen (ADVIL) 800 MG tablet Take 1 tablet (800 mg total) by mouth 3  (three) times daily. 09/22/20   Wieters, Hallie C, PA-C  IBUPROFEN PO Take by mouth.    [provider]  nystatin powder Apply 1 application topically 3 (three) times daily. 08/26/20   Gustavus Bryant, FNP  polyethylene glycol (MIRALAX / GLYCOLAX) 17 g packet Take 17 g by mouth daily. 09/06/20   Wieters, Hallie C, PA-C    Allergies    Novocain [procaine] and Penicillins  Review of Systems   Review of Systems  Cardiovascular:  Positive for chest pain.  All other systems reviewed and are negative.  Physical Exam Updated Vital Signs BP 102/80   Pulse (!) 55   Temp 98.5 F (36.9 C)   Resp (!) 21   SpO2 100%   Physical Exam Vitals and nursing note reviewed.  Constitutional:      General: He is not in acute distress.    Appearance: Normal appearance.  HENT:     Head: Normocephalic and atraumatic.  Eyes:     Conjunctiva/sclera: Conjunctivae normal.     Pupils: Pupils are equal, round, and reactive to light.  Cardiovascular:     Rate and Rhythm: Normal rate and regular rhythm.     Pulses: Normal pulses.  Pulmonary:     Effort: Pulmonary effort is normal. No respiratory distress.     Breath sounds: Normal breath sounds.  No wheezing.     Comments: Speaking in full sentences.  Clear lung sounds in all fields. Abdominal:     General: There is no distension.     Palpations: Abdomen is soft.     Tenderness: There is no abdominal tenderness.  Musculoskeletal:        General: Normal range of motion.     Cervical back: Normal range of motion and neck supple.  Skin:    General: Skin is warm and dry.     Capillary Refill: Capillary refill takes less than 2 seconds.  Neurological:     Mental Status: He is alert and oriented to person, place, and time.  Psychiatric:        Mood and Affect: Mood and affect normal.        Speech: Speech normal.        Behavior: Behavior normal.    ED Results / Procedures / Treatments   Labs (all labs ordered are listed, but only abnormal  results are displayed) Labs Reviewed  BASIC METABOLIC PANEL - Abnormal; Notable for the following components:      Result Value   Glucose, Bld 101 (*)    All other components within normal limits  CBC  TROPONIN I (HIGH SENSITIVITY)  TROPONIN I (HIGH SENSITIVITY)    EKG None  Radiology DG Chest 2 View  Result Date: 12/23/2020 CLINICAL DATA:  Chest pain. EXAM: CHEST - 2 VIEW COMPARISON:  Chest radiograph dated 03/10/2020. FINDINGS: The heart size and mediastinal contours are within normal limits. Both lungs are clear. The visualized skeletal structures are unremarkable. IMPRESSION: No active cardiopulmonary disease. Electronically Signed   By: Romona Curls M.D.   On: 12/23/2020 13:19    Procedures Procedures   Medications Ordered in ED Medications  alum & mag hydroxide-simeth (MAALOX/MYLANTA) 200-200-20 MG/5ML suspension 30 mL (30 mLs Oral Given 12/23/20 1831)    And  lidocaine (XYLOCAINE) 2 % viscous mouth solution 15 mL (15 mLs Oral Given 12/23/20 1831)    ED Course  I have reviewed the triage vital signs and the nursing notes.  Pertinent labs & imaging results that were available during my care of the patient were reviewed by me and considered in my medical decision making (see chart for details).    MDM Rules/Calculators/A&P                           Patient presented for evaluation of chest pain.  On exam, patient appears nontoxic.  Initial labs obtained in triage interpreted by me, overall reassuring.  Troponin negative.  Electrolytes stable.  EKG nonischemic.  Chest x-ray viewed and independently interpreted by me, no pneumonia, pneumothorax effusion.  Will delta troponin, give GI cocktail, and reassess.  Repeat troponin negative.  Patient with slight improvement after GI cocktail.  Discussed possible GI etiology.  Encourage follow-up with GI.  At this time, patient appears safe for discharge.  Return precautions given.  Patient states he understands and agrees to  plan  Final Clinical Impression(s) / ED Diagnoses Final diagnoses:  Atypical chest pain    Rx / DC Orders ED Discharge Orders          Ordered    pantoprazole (PROTONIX) 20 MG tablet  Daily        12/23/20 1857             [name], PA-C 12/23/20 1923    Arby Barrette, MD 12/30/20 2349

## 2020-12-23 NOTE — ED Triage Notes (Signed)
Patient reports to the ER for chest pain since Monday. Patient reports he was hoping it was just gas but it has gotten progressively worse

## 2020-12-23 NOTE — ED Notes (Signed)
Pt dc home . Stated understanding of dc instructions.  

## 2021-01-13 ENCOUNTER — Encounter: Payer: Self-pay | Admitting: Emergency Medicine

## 2021-04-21 ENCOUNTER — Encounter: Payer: Self-pay | Admitting: Internal Medicine

## 2021-04-21 ENCOUNTER — Ambulatory Visit (INDEPENDENT_AMBULATORY_CARE_PROVIDER_SITE_OTHER): Payer: 59 | Admitting: Internal Medicine

## 2021-04-21 DIAGNOSIS — R1013 Epigastric pain: Secondary | ICD-10-CM | POA: Diagnosis not present

## 2021-04-21 DIAGNOSIS — R1310 Dysphagia, unspecified: Secondary | ICD-10-CM | POA: Diagnosis not present

## 2021-04-21 MED ORDER — OMEPRAZOLE 40 MG PO CPDR: DELAYED_RELEASE_CAPSULE | ORAL | 3 refills | Status: DC

## 2021-04-21 NOTE — Patient Instructions (Signed)
If you are age 43 or younger, your body mass index should be between 19-25. Your Body mass index is 24.59 kg/m. If this is out of the aformentioned range listed, please consider follow up with your Primary Care Provider.  ________________________________________________________  The [place] [facility] providers would like to encourage you to use [facility] to communicate with providers for non-urgent requests or questions.  Due to long hold times on the telephone, sending your provider a message by [facility] may be a faster and more efficient way to get a response.  Please allow 48 business hours for a response.  Please remember that this is for non-urgent requests.  _______________________________________________________  We have sent the following medications to your pharmacy for you to pick up at your convenience:  START: Omeprazole 40mg  take one capsule 30 minutes prior to breakfast meal each day.  You have been scheduled for an endoscopy. Please follow written instructions given to you at your visit today. If you use inhalers (even only as needed), please bring them with you on the day of your procedure.  You have been scheduled for an abdominal ultrasound at [facility] (1st floor of hospital) on 04-27-21 at 9:30am. Please arrive 15 minutes prior to your appointment for registration. Make certain not to have anything to eat or drink 6 hours prior to your appointment. Should you need to reschedule your appointment, please contact radiology at [phone]. This test typically takes about 30 minutes to perform.  Due to recent changes in healthcare laws, you may see the results of your imaging and laboratory studies on MyChart before your provider has had a chance to review them.  We understand that in some cases there may be results that are confusing or concerning to you. Not all laboratory results come back in the same time frame and the provider may be waiting for multiple results in order to  interpret others.  Please give [place] 48 hours in order for your provider to thoroughly review all the results before contacting the office for clarification of your results.   Gastroesophageal Reflux Disease, Adult Gastroesophageal reflux (GER) happens when acid from the stomach flows up into the tube that connects the mouth and the stomach (esophagus). Normally, food travels down the esophagus and stays in the stomach to be digested. With GER, food and stomach acid sometimes move back up into the esophagus. You may have a disease called gastroesophageal reflux disease (GERD) if the reflux: Happens often. Causes frequent or very bad symptoms. Causes problems such as damage to the esophagus. When this happens, the esophagus becomes sore and swollen. Over time, GERD can make small holes (ulcers) in the lining of the esophagus. What are the causes? This condition is caused by a problem with the muscle between the esophagus and the stomach. When this muscle is weak or not normal, it does not close properly to keep food and acid from coming back up from the stomach. The muscle can be weak because of: Tobacco use. Pregnancy. Having a certain type of hernia (hiatal hernia). Alcohol use. Certain foods and drinks, such as coffee, chocolate, onions, and peppermint. What increases the risk? Being overweight. Having a disease that affects your connective tissue. Taking NSAIDs, such a ibuprofen. What are the signs or symptoms? Heartburn. Difficult or painful swallowing. The feeling of having a lump in the throat. A bitter taste in the mouth. Bad breath. Having a lot of saliva. Having an upset or bloated stomach. Burping. Chest pain. Different conditions can cause  chest pain. Make sure you see your doctor if you have chest pain. Shortness of breath or wheezing. A long-term cough or a cough at night. Wearing away of the surface of teeth (tooth enamel). Weight loss. How is this treated? Making  changes to your diet. Taking medicine. Having surgery. Treatment will depend on how bad your symptoms are. Follow these instructions at home: Eating and drinking  Follow a diet as told by your doctor. You may need to avoid foods and drinks such as: Coffee and tea, with or without caffeine. Drinks that contain alcohol. Energy drinks and sports drinks. Bubbly (carbonated) drinks or sodas. Chocolate and cocoa. Peppermint and mint flavorings. Garlic and onions. Horseradish. Spicy and acidic foods. These include peppers, chili powder, curry powder, vinegar, hot sauces, and BBQ sauce. Citrus fruit juices and citrus fruits, such as oranges, lemons, and limes. Tomato-based foods. These include red sauce, chili, salsa, and pizza with red sauce. Fried and fatty foods. These include donuts, french fries, potato chips, and high-fat dressings. High-fat meats. These include hot dogs, rib eye steak, sausage, ham, and bacon. High-fat dairy items, such as whole milk, butter, and cream cheese. Eat small meals often. Avoid eating large meals. Avoid drinking large amounts of liquid with your meals. Avoid eating meals during the 2-3 hours before bedtime. Avoid lying down right after you eat. Do not exercise right after you eat. Lifestyle  Do not smoke or use any products that contain nicotine or tobacco. If you need help quitting, ask your doctor. Try to lower your stress. If you need help doing this, ask your doctor. If you are overweight, lose an amount of weight that is healthy for you. Ask your doctor about a safe weight loss goal. General instructions Pay attention to any changes in your symptoms. Take over-the-counter and prescription medicines only as told by your doctor. Do not take aspirin, ibuprofen, or other NSAIDs unless your doctor says it is okay. Wear loose clothes. Do not wear anything tight around your waist. Raise (elevate) the head of your bed about 6 inches (15 cm). You may need  to use a wedge to do this. Avoid bending over if this makes your symptoms worse. Keep all follow-up visits. Contact a doctor if: You have new symptoms. You lose weight and you do not know why. You have trouble swallowing or it hurts to swallow. You have wheezing or a cough that keeps happening. You have a hoarse voice. Your symptoms do not get better with treatment. Get help right away if: You have sudden pain in your arms, neck, jaw, teeth, or back. You suddenly feel sweaty, dizzy, or light-headed. You have chest pain or shortness of breath. You vomit and the vomit is green, yellow, or black, or it looks like blood or coffee grounds. You faint. Your poop (stool) is red, bloody, or black. You cannot swallow, drink, or eat. These symptoms may represent a serious problem that is an emergency. Do not wait to see if the symptoms will go away. Get medical help right away. Call your local emergency services (911 in the [place] Do not drive yourself to the hospital. Summary If a person has gastroesophageal reflux disease (GERD), food and stomach acid move back up into the esophagus and cause symptoms or problems such as damage to the esophagus. Treatment will depend on how bad your symptoms are. Follow a diet as told by your doctor. Take all medicines only as told by your doctor. This information is not intended to  replace advice given to you by your health care provider. Make sure you discuss any questions you have with your health care provider. Document Revised: 08/25/2019 Document Reviewed: 08/25/2019 Elsevier Patient Education  2022 [employer].   Thank you for entrusting me with your care and choosing [facility].  Dr Leonides Schanz

## 2021-04-21 NOTE — Progress Notes (Signed)
Chief Complaint: Epigastric ab pain  HPI : 41 year old male presents with epigastric ab pain  He has had significant epigastric abdominal pain. This pain has been going on since summer 2022 but worsened over the last month. For a few days he had to miss work due to the severity of his of epigastric abdominal pain. His omeprazole has been keeping the pain under control. He is taking omeprazole 20 mg QD after he eats. The omeprazole has helped with the pain, but has not taken it away completely. The pain that he feels in the epigastric area is sometimes felt in the RUQ. When he gets hot, it seems to make the pain worse. He is taking ibuprofen once or twice or month. He has adjusted his diet to avoid fast foods and eating more organic food. He has cut down on sodas. He has lost 12-13 lbs this year due to dietary changes that he has instituted. Drinks one beer 3-4 weeks. He drinks on average 2 caffeinated beverages per day. Denies melena, hematochezia. Occasionally if he tries to something that is really sticky, he will find that it will occasionally get it stuck in his lower chest. This has been happening over the last few months. Denies dysphagia with liquids. Has had some softer stools. Endorses some nausea. Denies vomiting. He occasionally has very little indigestion with some regurgitation. Denies fam hx of GI disorders.  Back in 2017 he had significant right lower quadrant pain that was attributed to gastroenteritis.  Although his CT imaging showed appendicitis at that time, he had improvement in his symptoms without surgery.  Denies prior abdominal surgeries.  Wt Readings from Last 3 Encounters:  04/21/21 157 lb (71.2 kg)  06/19/17 155 lb (70.3 kg)  04/17/16 157 lb (71.2 kg)   History reviewed. No pertinent past medical history.   Past Surgical History:  Procedure Laterality Date   NO PAST SURGERIES     Family History  Problem Relation Age of Onset   COPD Mother    Arthritis Mother     Hypertension Mother    Diabetes Brother    Social History   Tobacco Use   Smoking status: Former    Packs/day: 0.50    Types: E-cigarettes, Cigarettes   Smokeless tobacco: Never  Vaping Use   Vaping Use: Every day   Substances: Flavoring  Substance Use Topics   Alcohol use: No   Drug use: No   Current Outpatient Medications  Medication Sig Dispense Refill   Multiple Vitamin (MULTIVITAMIN) tablet Take 1 tablet by mouth daily.     omeprazole (PRILOSEC) 40 MG capsule Take 1 capsule 30 minutes prior to breakfast meal each day. 90 capsule 3   No current facility-administered medications for this visit.   Allergies  Allergen Reactions   Novocain [Procaine]     And lidocaine "Feels like my head's tingling"   Penicillins     Describes black out episode after PCN injection 15 yrs ago     Review of Systems: All systems reviewed and negative except where noted in HPI.   Physical Exam: BP 100/60    Pulse 61    Ht 5\' 7"  (1.702 m)    Wt 157 lb (71.2 kg)    BMI 24.59 kg/m  Constitutional: Pleasant,well-developed, male in no acute distress. HEENT: Normocephalic and atraumatic. Conjunctivae are normal. No scleral icterus. Cardiovascular: Normal rate, regular rhythm.  Pulmonary/chest: Effort normal and breath sounds normal. No wheezing, rales or rhonchi. Abdominal: Soft, nondistended,tender in  the epigastric area. Bowel sounds active throughout. There are no masses palpable. No hepatomegaly. Extremities: No edema Neurological: Alert and oriented to person place and time. Skin: Skin is warm and dry. No rashes noted. Psychiatric: Normal mood and affect. Behavior is normal.  Labs 08/2020: CMP and lipase unremarkable. CBC unremarkable.  Labs 11/2020: CBC and BMP nml.  CT A/P w/contrast 09/28/15: IMPRESSION: CT findings of enteritis. Early appendicitis, less likely reactive changes secondary to enteritis.  ASSESSMENT AND PLAN:  Epigastric ab pain Dysphagia Patient presents with  epigastric abdominal pain that has been worsening over time.  Also describes some intermittent dysphagia to solid foods.  At this time his symptoms could be due to GERD, PUD, gastritis/duodenitis, or gallstones.  Will have him increase his omeprazole from 20 mg daily to 40 mg daily and instructed him to take it 30 minutes before eating.  Will obtain a right upper quadrant ultrasound to rule out gallstones and plan for an EGD for further evaluation of his dysphagia and epigastric abdominal pain.  Also counseled on suggestions to try and help with potential GERD such as avoiding certain foods and not eating before bedtime. - GERD handout - Increase omeprazole 20 mg to 40 mg QD. Take 30 minutes before eating - RUQ U/S - EGD [facility]  Eulah Pont, MD

## 2021-04-27 ENCOUNTER — Ambulatory Visit (HOSPITAL_COMMUNITY)
Admission: RE | Admit: 2021-04-27 | Discharge: 2021-04-27 | Disposition: A | Discharge status: Home / Self Care | Payer: 59 | Source: Ambulatory Visit | Attending: Internal Medicine | Admitting: Internal Medicine

## 2021-04-27 ENCOUNTER — Other Ambulatory Visit: Payer: Self-pay

## 2021-04-27 DIAGNOSIS — R1310 Dysphagia, unspecified: Secondary | ICD-10-CM | POA: Insufficient documentation

## 2021-04-27 DIAGNOSIS — R1013 Epigastric pain: Secondary | ICD-10-CM | POA: Insufficient documentation

## 2021-05-30 ENCOUNTER — Telehealth: Payer: Self-pay | Admitting: Internal Medicine

## 2021-05-30 NOTE — Telephone Encounter (Signed)
This patient had an EGD scheduled for tomorrow at 1:30.  He called this morning to cancel due to a work schedule conflict.  He has rescheduled for 4/26 at 1:30 p.m. ?

## 2021-05-31 ENCOUNTER — Encounter: Payer: 59 | Admitting: Internal Medicine

## 2021-06-22 ENCOUNTER — Encounter: Payer: 59 | Admitting: Internal Medicine

## 2021-11-13 ENCOUNTER — Ambulatory Visit
Admission: RE | Admit: 2021-11-13 | Discharge: 2021-11-13 | Disposition: A | Discharge status: Home / Self Care | Payer: 59 | Source: Ambulatory Visit | Attending: Family Medicine | Admitting: Family Medicine

## 2021-11-13 ENCOUNTER — Other Ambulatory Visit: Payer: Self-pay

## 2021-11-13 VITALS — BP 125/83 | HR 61 | Temp 97.9°F | Resp 18

## 2021-11-13 DIAGNOSIS — M5412 Radiculopathy, cervical region: Secondary | ICD-10-CM

## 2021-11-13 MED ORDER — PREDNISONE 20 MG PO TABS: 40.0000 mg | ORAL_TABLET | Freq: Every day | ORAL | 0 refills | Status: DC

## 2021-11-13 MED ORDER — CYCLOBENZAPRINE HCL 10 MG PO TABS: 10.0000 mg | ORAL_TABLET | Freq: Three times a day (TID) | ORAL | 0 refills | Status: DC | PRN

## 2021-11-13 NOTE — ED Triage Notes (Signed)
Pt here for left sided neck pain into upper back x 1 week he thinks is from a nerve issue

## 2021-11-13 NOTE — Discharge Instructions (Signed)
You may apply heat and neck wraps, massage, gentle stretches and follow-up with primary care for a recheck of your symptoms.  May consider physical therapy referral if not fully resolving.

## 2021-11-13 NOTE — ED Provider Notes (Signed)
Mark Bright    CSN: [id number] Arrival date & time: 11/13/21  1145      History   Chief Complaint Chief Complaint  Patient presents with   Neck Injury    Had random neck pain on left side and has turned into a consistent pain and stiffness - Entered by patient    HPI Mark Bright is a 41 y.o. male.   Pt here for left sided neck pain into upper back x 1 week he thinks is from a nerve issue  Patient presenting today with left-sided neck pain, muscle soreness and radiation down towards shoulder for the past week.  Symptoms are significantly worse the past few days but when he woke up this morning his symptoms had lessened somewhat.  Denies numbness or tingling to the arm, chest pain, shortness of breath, dizziness, midline neck pain or decreased range of motion, injury to the neck.  He has been trying over-the-counter anti-inflammatories with minimal relief.    History reviewed. No pertinent past medical history.  Patient Active Problem List   Diagnosis Date Noted   Poor dentition 04/17/2016    Past Surgical History:  Procedure Laterality Date   NO PAST SURGERIES         Home Medications    Prior to Admission medications   Medication Sig Start Date End Date Taking? Authorizing Provider  cyclobenzaprine (FLEXERIL) 10 MG tablet Take 1 tablet (10 mg total) by mouth 3 (three) times daily as needed for muscle spasms. Do Not drink alcohol or drive while taking this medication.  May cause drowsiness. 11/13/21  Yes Volney American, PA-C  predniSONE (DELTASONE) 20 MG tablet Take 2 tablets (40 mg total) by mouth daily with breakfast. 11/13/21  Yes Volney American, PA-C  Multiple Vitamin (MULTIVITAMIN) tablet Take 1 tablet by mouth daily.    [provider]  omeprazole (PRILOSEC) 40 MG capsule Take 1 capsule 30 minutes prior to breakfast meal each day. 04/21/21   Sharyn Creamer, MD    Family History Family History  Problem Relation Age of  Onset   COPD Mother    Arthritis Mother    Hypertension Mother    Diabetes Brother     Social History Social History   Tobacco Use   Smoking status: Former    Packs/day: 0.50    Types: E-cigarettes, Cigarettes   Smokeless tobacco: Never  Vaping Use   Vaping Use: Every day   Substances: Flavoring  Substance Use Topics   Alcohol use: No   Drug use: No     Allergies   Novocain [procaine] and Penicillins   Review of Systems Review of Systems Per HPI  Physical Exam Triage Vital Signs ED Triage Vitals  Enc Vitals Group     BP 11/13/21 1203 125/83     Pulse Rate 11/13/21 1203 61     Resp 11/13/21 1203 18     Temp 11/13/21 1203 97.9 F (36.6 C)     Temp Source 11/13/21 1203 Oral     SpO2 11/13/21 1203 99 %     Weight --      Height --      Head Circumference --      Peak Flow --      Pain Score 11/13/21 1204 5     Pain Loc --      Pain Edu? --      Excl. in [place]? --    No data found.  Updated Vital Signs BP  125/83 (BP Location: Left Arm)   Pulse 61   Temp 97.9 F (36.6 C) (Oral)   Resp 18   SpO2 99%   Visual Acuity Right Eye Distance:   Left Eye Distance:   Bilateral Distance:    Right Eye Near:   Left Eye Near:    Bilateral Near:     Physical Exam Vitals and nursing note reviewed.  Constitutional:      Appearance: Normal appearance.  HENT:     Head: Atraumatic.  Eyes:     Extraocular Movements: Extraocular movements intact.     Conjunctiva/sclera: Conjunctivae normal.  Cardiovascular:     Rate and Rhythm: Normal rate and regular rhythm.  Pulmonary:     Effort: Pulmonary effort is normal.     Breath sounds: Normal breath sounds.  Musculoskeletal:        General: Tenderness present. Normal range of motion.     Cervical back: Normal range of motion and neck supple.     Comments: No midline spinal tenderness to palpation diffusely, range of motion of the C-spine intact.  Left cervical paraspinal and SCM muscles tender to palpation and mild  spasm.  Range of motion to the left upper extremity intact, strength full and equal bilateral hands  Skin:    General: Skin is warm and dry.     Findings: No erythema.  Neurological:     General: No focal deficit present.     Mental Status: He is oriented to person, place, and time.     Comments: Left upper extremity neurovascularly intact  Psychiatric:        Mood and Affect: Mood normal.        Thought Content: Thought content normal.        Judgment: Judgment normal.      UC Treatments / Results  Labs (all labs ordered are listed, but only abnormal results are displayed) Labs Reviewed - No data to display  EKG   Radiology No results found.  Procedures Procedures (including critical Bright time)  Medications Ordered in UC Medications - No data to display  Initial Impression / Assessment and Plan / UC Course  I have reviewed the triage vital signs and the nursing notes.  Pertinent labs & imaging results that were available during my Bright of the patient were reviewed by me and considered in my medical decision making (see chart for details).     Suspect some muscle spasms causing radiculopathy.  Treat with prednisone, Flexeril, heat, stretches, rest.  Follow-up with PCP for recheck, return for worsening symptoms.  Final Clinical Impressions(s) / UC Diagnoses   Final diagnoses:  Cervical radiculitis     Discharge Instructions      You may apply heat and neck wraps, massage, gentle stretches and follow-up with primary Bright for a recheck of your symptoms.  May consider physical therapy referral if not fully resolving.    ED Prescriptions     Medication Sig Dispense Auth. Provider   predniSONE (DELTASONE) 20 MG tablet Take 2 tablets (40 mg total) by mouth daily with breakfast. 10 tablet Particia Nearing, PA-C   cyclobenzaprine (FLEXERIL) 10 MG tablet Take 1 tablet (10 mg total) by mouth 3 (three) times daily as needed for muscle spasms. Do Not drink alcohol  or drive while taking this medication.  May cause drowsiness. 15 tablet Particia Nearing, [place]      PDMP not reviewed this encounter.   Particia Nearing, [place] 11/13/21 1235

## 2022-03-03 ENCOUNTER — Ambulatory Visit
Admission: RE | Admit: 2022-03-03 | Discharge: 2022-03-03 | Disposition: A | Discharge status: Home / Self Care | Payer: 59 | Source: Ambulatory Visit | Attending: Nurse Practitioner | Admitting: Nurse Practitioner

## 2022-03-03 VITALS — BP 136/77 | HR 61 | Temp 97.8°F | Resp 18 | Ht 67.0 in | Wt 155.0 lb

## 2022-03-03 DIAGNOSIS — M5441 Lumbago with sciatica, right side: Secondary | ICD-10-CM | POA: Diagnosis not present

## 2022-03-03 DIAGNOSIS — M5442 Lumbago with sciatica, left side: Secondary | ICD-10-CM

## 2022-03-03 MED ORDER — PREDNISONE 10 MG (21) PO TBPK: ORAL_TABLET | Freq: Every day | ORAL | 0 refills | Status: DC

## 2022-03-03 NOTE — ED Provider Notes (Signed)
[facility]    CSN: [id number] Arrival date & time: 03/03/22  1302      History   Chief Complaint Chief Complaint  Patient presents with   Generalized Body Aches    Have been getting numbness in arms and legs along with sharp pains in muscles and joints. Now has gotten constant without much relief - Entered by patient   Numbness    HPI Mark Bright is a 42 y.o. male presents for evaluation of low back pain for couple weeks. Patient describes the pain as aching/sharp radiates down both his legs.  Patient has a history of back pain. Patient denies fever, chills, abdominal pain, flank pain, dysuria, urinary frequency/urgency, numbness/weakness of the lower extremities.  Does report intermittent tingling bilateral legs that resolves when he changes position.  No bowel and bladder incontinence. No saddle paraesthesia. Patient no history of back injuries/surgeries in the past. Patient has used ibuprofen OTC medications that have not helped.  He denies any known injury or inciting event prior to onset of the back pain.  Does state it is worsened over the past 3 days.  Does have a history of cervical radiculopathy that he was seen in the ER in September for.  Reports he has been trying to find an orthopedist to workup his symptoms but is having "the run around".  He does not want to go to [facility] due to previous bad experience.  His primary concern today is his worsening low back pain.  No other c/o today.   HPI  History reviewed. No pertinent past medical history.  Patient Active Problem List   Diagnosis Date Noted   Poor dentition 04/17/2016    Past Surgical History:  Procedure Laterality Date   NO PAST SURGERIES         Home Medications    Prior to Admission medications   Medication Sig Start Date End Date Taking? Authorizing Provider  predniSONE (STERAPRED UNI-PAK 21 TAB) 10 MG (21) TBPK tablet Take by mouth daily. Take 6 tabs by mouth daily  for 2 days,  then 5 tabs for 2 days, then 4 tabs for 2 days, then 3 tabs for 2 days, 2 tabs for 2 days, then 1 tab by mouth daily for 2 days 03/03/22  Yes Melynda Ripple, NP  cyclobenzaprine (FLEXERIL) 10 MG tablet Take 1 tablet (10 mg total) by mouth 3 (three) times daily as needed for muscle spasms. Do Not drink alcohol or drive while taking this medication.  May cause drowsiness. 11/13/21   Volney American, PA-C  Multiple Vitamin (MULTIVITAMIN) tablet Take 1 tablet by mouth daily.    [provider]  omeprazole (PRILOSEC) 40 MG capsule Take 1 capsule 30 minutes prior to breakfast meal each day. 04/21/21   Sharyn Creamer, MD    Family History Family History  Problem Relation Age of Onset   COPD Mother    Arthritis Mother    Hypertension Mother    Diabetes Brother     Social History Social History   Tobacco Use   Smoking status: Former    Packs/day: 0.50    Types: E-cigarettes, Cigarettes   Smokeless tobacco: Never  Vaping Use   Vaping Use: Every day   Substances: Flavoring  Substance Use Topics   Alcohol use: No   Drug use: No     Allergies   Novocain [procaine] and Penicillins   Review of Systems Review of Systems  Musculoskeletal:  Positive for back pain.  Physical Exam Triage Vital Signs ED Triage Vitals  Enc Vitals Group     BP 03/03/22 1315 136/77     Pulse Rate 03/03/22 1315 61     Resp 03/03/22 1315 18     Temp 03/03/22 1315 97.8 F (36.6 C)     Temp Source 03/03/22 1315 Oral     SpO2 03/03/22 1315 98 %     Weight 03/03/22 1317 155 lb (70.3 kg)     Height 03/03/22 1317 5\' 7"  (1.702 m)     Head Circumference --      Peak Flow --      Pain Score 03/03/22 1316 6     Pain Loc --      Pain Edu? --      Excl. in [place]? --    No data found.  Updated Vital Signs BP 136/77 (BP Location: Left Arm)   Pulse 61   Temp 97.8 F (36.6 C) (Oral)   Resp 18   Ht 5\' 7"  (1.702 m)   Wt 155 lb (70.3 kg)   SpO2 98%   BMI 24.28 kg/m   Visual Acuity Right Eye  Distance:   Left Eye Distance:   Bilateral Distance:    Right Eye Near:   Left Eye Near:    Bilateral Near:     Physical Exam Vitals and nursing note reviewed.  Constitutional:      Appearance: Normal appearance.  HENT:     Head: Normocephalic and atraumatic.  Eyes:     Pupils: Pupils are equal, round, and reactive to light.  Cardiovascular:     Rate and Rhythm: Normal rate.  Pulmonary:     Effort: Pulmonary effort is normal.  Musculoskeletal:     Cervical back: Normal.     Thoracic back: Normal.     Lumbar back: Tenderness present. No swelling, edema, deformity, signs of trauma, lacerations, spasms or bony tenderness. Normal range of motion. Positive right straight leg raise test and positive left straight leg raise test. No scoliosis.       Back:     Comments: Bilateral lower extremity strength 5 out of 5  Skin:    General: Skin is warm and dry.  Neurological:     General: No focal deficit present.     Mental Status: He is alert and oriented to person, place, and time.     Deep Tendon Reflexes:     Reflex Scores:      Patellar reflexes are 2+ on the right side and 2+ on the left side. Psychiatric:        Mood and Affect: Mood normal.        Behavior: Behavior normal.      UC Treatments / Results  Labs (all labs ordered are listed, but only abnormal results are displayed) Labs Reviewed - No data to display  EKG   Radiology No results found.  Procedures Procedures (including critical care time)  Medications Ordered in UC Medications - No data to display  Initial Impression / Assessment and Plan / UC Course  I have reviewed the triage vital signs and the nursing notes.  Pertinent labs & imaging results that were available during my care of the patient were reviewed by me and considered in my medical decision making (see chart for details).     Reviewed exam and symptoms with patient.  No red flags on exam. I will refer to orthopedics Prednisone  taper.  Patient declined Toradol injection and steroid injection  in clinic He will continue OTC and NSAIDs as needed Rest Encouraged to establish with a PCP for further treatment options and overall health maintenance Strict ER precautions reviewed and patient verbalized understanding Final Clinical Impressions(s) / UC Diagnoses   Final diagnoses:  Acute right-sided low back pain with bilateral sciatica     Discharge Instructions      Prednisone as prescribed Continue over-the-counter ibuprofen as needed Follow-up with Dr. Ave Filter at [facility] orthopedics for further evaluation and treatment options Please go to emergency room if you have any worsening symptoms   ED Prescriptions     Medication Sig Dispense Auth. Provider   predniSONE (STERAPRED UNI-PAK 21 TAB) 10 MG (21) TBPK tablet Take by mouth daily. Take 6 tabs by mouth daily  for 2 days, then 5 tabs for 2 days, then 4 tabs for 2 days, then 3 tabs for 2 days, 2 tabs for 2 days, then 1 tab by mouth daily for 2 days 42 tablet Radford Pax, NP      PDMP not reviewed this encounter.   Radford Pax, NP 03/03/22 1345

## 2022-03-03 NOTE — ED Triage Notes (Signed)
Patient c/o numbness in both arms and legs for several weeks.  Patient feels that he has a pinched nerve in his back and neck.  No apparent injury.

## 2022-03-03 NOTE — Discharge Instructions (Signed)
Prednisone as prescribed Continue over-the-counter ibuprofen as needed Follow-up with Dr. Tamera Punt at [place] for further evaluation and treatment options Please go to emergency room if you have any worsening symptoms

## 2022-03-04 ENCOUNTER — Emergency Department (HOSPITAL_COMMUNITY)
Admission: EM | Admit: 2022-03-04 | Discharge: 2022-03-04 | Disposition: A | Discharge status: Home / Self Care | Payer: 59 | Attending: Emergency Medicine | Admitting: Emergency Medicine

## 2022-03-04 ENCOUNTER — Emergency Department (HOSPITAL_COMMUNITY): Payer: 59

## 2022-03-04 ENCOUNTER — Encounter (HOSPITAL_COMMUNITY): Payer: Self-pay

## 2022-03-04 DIAGNOSIS — M5442 Lumbago with sciatica, left side: Secondary | ICD-10-CM | POA: Diagnosis not present

## 2022-03-04 DIAGNOSIS — R200 Anesthesia of skin: Secondary | ICD-10-CM | POA: Diagnosis present

## 2022-03-04 DIAGNOSIS — M5441 Lumbago with sciatica, right side: Secondary | ICD-10-CM | POA: Diagnosis not present

## 2022-03-04 MED ORDER — GABAPENTIN 100 MG PO CAPS: 100.0000 mg | ORAL_CAPSULE | Freq: Three times a day (TID) | ORAL | 0 refills | Status: DC

## 2022-03-04 NOTE — Discharge Instructions (Signed)
Thank you for allowing me to be part of your care today.  Your CT scan did not show any obvious cause for your back pain.  I recommend keeping your appointment with the spine specialist later this month.   I have sent a prescription for gabapentin to help with nerve related pain to your pharmacy.   Return to the ER if you develop any worsening of your symptoms including weakness in your arms or legs, loss of bladder control, urinary retention, loss of bowel control, inability to walk.

## 2022-03-04 NOTE — ED Provider Notes (Signed)
[facility]-EMERGENCY DEPT Provider Note   CSN: [id number] Arrival date & time: 03/04/22  1009     History  Chief Complaint  Patient presents with   Abdominal Pain   Male GU Problem    Mark Bright is a 42 y.o. male presents to the ED complaining of transient numbness and tingling in upper and lower extremities for the past several weeks.  Patient was seen at urgent care recently for lower back pain with bilateral sciatica and was prescribed prednisone.  Patient has taken the first day of prednisone taper, woke up this morning with shooting pains in his groin and was concerned.  He states he has never experienced lower back pain in the past, does not know of any inciting injury, and denies any prior back surgeries.  Patient also concerned because he has not had an erection in 4 days.  Denies fever, chills, difficulty urinating, loss of bowel or bladder control, weakness in the upper or lower extremities, unilateral weakness, headache, syncope, dysuria, penile pain.       Home Medications Prior to Admission medications   Medication Sig Start Date End Date Taking? Authorizing Provider  gabapentin (NEURONTIN) 100 MG capsule Take 1 capsule (100 mg total) by mouth 3 (three) times daily. 03/04/22  Yes Abb Gobert R, PA  cyclobenzaprine (FLEXERIL) 10 MG tablet Take 1 tablet (10 mg total) by mouth 3 (three) times daily as needed for muscle spasms. Do Not drink alcohol or drive while taking this medication.  May cause drowsiness. 11/13/21   Particia Nearing, PA-C  Multiple Vitamin (MULTIVITAMIN) tablet Take 1 tablet by mouth daily.    [provider]  omeprazole (PRILOSEC) 40 MG capsule Take 1 capsule 30 minutes prior to breakfast meal each day. 04/21/21   Imogene Burn, MD  predniSONE (STERAPRED UNI-PAK 21 TAB) 10 MG (21) TBPK tablet Take by mouth daily. Take 6 tabs by mouth daily  for 2 days, then 5 tabs for 2 days, then 4 tabs for 2 days, then 3 tabs for 2  days, 2 tabs for 2 days, then 1 tab by mouth daily for 2 days 03/03/22   Radford Pax, NP      Allergies    Novocain [procaine] and Penicillins    Review of Systems   Review of Systems  Gastrointestinal:  Negative for abdominal pain.  Genitourinary:  Negative for decreased urine volume, difficulty urinating, frequency, penile pain and testicular pain.  Musculoskeletal:  Positive for back pain and neck pain. Negative for gait problem.  Neurological:  Positive for numbness. Negative for syncope, weakness and headaches.    Physical Exam Updated Vital Signs BP 92/63   Pulse 68   Temp (!) 97.5 F (36.4 C)   Resp 18   SpO2 100%  Physical Exam Vitals and nursing note reviewed.  Constitutional:      General: He is not in acute distress.    Appearance: Normal appearance. He is not ill-appearing or diaphoretic.  HENT:     Mouth/Throat:     Mouth: Mucous membranes are moist.     Pharynx: Oropharynx is clear.  Cardiovascular:     Rate and Rhythm: Normal rate and regular rhythm.     Pulses:          Radial pulses are 2+ on the right side and 2+ on the left side.       Dorsalis pedis pulses are 2+ on the right side and 2+ on the left side.  Pulmonary:     Effort: Pulmonary effort is normal. No respiratory distress.  Abdominal:     General: Abdomen is flat. There is no distension.     Palpations: Abdomen is soft.     Tenderness: There is no abdominal tenderness.  Musculoskeletal:     Cervical back: Tenderness present. No signs of trauma, torticollis or bony tenderness. No pain with movement. Normal range of motion.     Thoracic back: No deformity, tenderness or bony tenderness. Normal range of motion.     Lumbar back: Tenderness and bony tenderness present. No deformity, signs of trauma or spasms. Normal range of motion. Positive right straight leg raise test and positive left straight leg raise test.  Skin:    General: Skin is warm and dry.     Capillary Refill: Capillary refill  takes less than 2 seconds.  Neurological:     General: No focal deficit present.     Mental Status: He is alert. Mental status is at baseline.     Sensory: Sensation is intact.     Motor: Motor function is intact. No weakness or abnormal muscle tone.     Coordination: Coordination is intact.     Gait: Gait is intact.  Psychiatric:        Mood and Affect: Mood normal.        Behavior: Behavior normal.     ED Results / Procedures / Treatments   Labs (all labs ordered are listed, but only abnormal results are displayed) Labs Reviewed - No data to display  EKG None  Radiology CT Lumbar Spine Wo Contrast  Result Date: 03/04/2022 CLINICAL DATA:  Lumbar radiculopathy with symptoms persisting over 6 weeks of treatment. EXAM: CT LUMBAR SPINE WITHOUT CONTRAST TECHNIQUE: Multidetector CT imaging of the lumbar spine was performed without intravenous contrast administration. Multiplanar CT image reconstructions were also generated. RADIATION DOSE REDUCTION: This exam was performed according to the departmental dose-optimization program which includes automated exposure control, adjustment of the mA and/or kV according to patient size and/or use of iterative reconstruction technique. COMPARISON:  None Available. FINDINGS: Segmentation: 5 lumbar type vertebrae with incomplete separation at the left L5 transverse process and sacrum and hypoplastic ribs at the T12 level. Alignment: Normal. Vertebrae: No acute fracture or focal pathologic process. Paraspinal and other soft tissues: Negative. Disc levels: No visible herniation. No notable facet degeneration. No neural impingement. IMPRESSION: No acute finding or visible impingement. Electronically Signed   By: Tiburcio Pea M.D.   On: 03/04/2022 12:34    Procedures Procedures    Medications Ordered in ED Medications - No data to display  ED Course/ Medical Decision Making/ A&P                           Medical Decision Making Amount and/or  Complexity of Data Reviewed Radiology: ordered.   This patient presents to the ED with chief complaint(s) of lumbar back pain that is progressively worsening with associated groin pain.  The complaint involves an extensive differential diagnosis and also carries with it a high risk of complications and morbidity.    The differential diagnosis includes degenerative joint disease, disc herniation with sciatica, acute muscle strain, acute ligamentous injury, spondylolisthesis, lumbar fracture or dislocation, spinal stenosis  The initial plan is to obtain CT lumbar spine  Additional history obtained: Additional history obtained from  none Records reviewed  UC note from 03/03/22  Initial Assessment:   Exam significant for tenderness  to palpation of the lumbar spine and paraspinal muscles bilaterally.  No tenderness to palpation of abdomen or bilateral flanks.  5/5 strength of upper and lower extremities bilaterally.  Sensation intact in upper and lower extremities bilaterally.  Patient observed walking with normal gait.  Coordination is also intact.  No obvious injury, deformity, or malalignment of spine or pelvis.  Independent ECG/labs interpretation:  The following labs were independently interpreted:  Not indicated  Independent visualization and interpretation of imaging: I independently visualized the following imaging with scope of interpretation limited to determining acute life threatening conditions related to emergency care: CT lumbar spine, which revealed no evidence of acute fracture, degeneration of vertebrae or facet joints, and good disc space was maintained.  No obvious cause of patient's pain.    Treatment and Reassessment: None  Disposition:   The patient has been appropriately medically screened and/or stabilized in the ED. I have low suspicion for any other emergent medical condition which would require further screening, evaluation or treatment in the ED or require inpatient  management. At time of discharge the patient is hemodynamically stable and in no acute distress. I have discussed work-up results and diagnosis with patient and answered all questions. Patient is agreeable with discharge plan. We discussed strict return precautions for returning to the emergency department and they verbalized understanding.  Discussed with patient supportive care measures for home and advised him to continue taking the prednisone taper that was prescribed by UC.  Recommended patient keep his follow-up appointment with spine specialist later this month.  Will send prescription for gabapentin to help with nerve pain.  Discussed red flags of back pain that would warrant return to the ED.             Final Clinical Impression(s) / ED Diagnoses Final diagnoses:  Acute bilateral low back pain with bilateral sciatica    Rx / DC Orders ED Discharge Orders          Ordered    gabapentin (NEURONTIN) 100 MG capsule  3 times daily        03/04/22 1303              Pat Kocher, [place] 03/04/22 1303    Valarie Merino, MD 03/04/22 1358

## 2022-03-04 NOTE — ED Triage Notes (Signed)
Pt arrived via POV, c/o numbness and tingling in both arms and legs for several weeks, was seen at [facility], told to come to ED if abd pain or groin pain appeared. Pt states pain in groin and penis started this morning.

## 2022-03-08 ENCOUNTER — Emergency Department (HOSPITAL_COMMUNITY)
Admission: EM | Admit: 2022-03-08 | Discharge: 2022-03-08 | Disposition: A | Discharge status: Home / Self Care | Payer: 59 | Attending: Emergency Medicine | Admitting: Emergency Medicine

## 2022-03-08 ENCOUNTER — Emergency Department (HOSPITAL_COMMUNITY): Payer: 59

## 2022-03-08 ENCOUNTER — Encounter (HOSPITAL_COMMUNITY): Payer: Self-pay

## 2022-03-08 DIAGNOSIS — K4090 Unilateral inguinal hernia, without obstruction or gangrene, not specified as recurrent: Secondary | ICD-10-CM | POA: Insufficient documentation

## 2022-03-08 DIAGNOSIS — N50811 Right testicular pain: Secondary | ICD-10-CM | POA: Diagnosis present

## 2022-03-08 LAB — URINALYSIS, ROUTINE W REFLEX MICROSCOPIC
Bilirubin Urine: NEGATIVE
Glucose, UA: NEGATIVE mg/dL
Hgb urine dipstick: NEGATIVE
Ketones, ur: NEGATIVE mg/dL
Leukocytes,Ua: NEGATIVE
Nitrite: NEGATIVE
Protein, ur: NEGATIVE mg/dL
Specific Gravity, Urine: 1.024 (ref 1.005–1.030)
pH: 5 (ref 5.0–8.0)

## 2022-03-08 LAB — CBC
HCT: 47.3 % (ref 39.0–52.0)
Hemoglobin: 16.3 g/dL (ref 13.0–17.0)
MCH: 31.4 pg (ref 26.0–34.0)
MCHC: 34.5 g/dL (ref 30.0–36.0)
MCV: 91.1 fL (ref 80.0–100.0)
Platelets: 342 10*3/uL (ref 150–400)
RBC: 5.19 MIL/uL (ref 4.22–5.81)
RDW: 11.8 % (ref 11.5–15.5)
WBC: 12.9 10*3/uL — ABNORMAL HIGH (ref 4.0–10.5)
nRBC: 0 % (ref 0.0–0.2)

## 2022-03-08 LAB — BASIC METABOLIC PANEL
Anion gap: 9 (ref 5–15)
BUN: 19 mg/dL (ref 6–20)
CO2: 22 mmol/L (ref 22–32)
Calcium: 9.3 mg/dL (ref 8.9–10.3)
Chloride: 106 mmol/L (ref 98–111)
Creatinine, Ser: 1.07 mg/dL (ref 0.61–1.24)
GFR, Estimated: >60 mL/min (ref >60)
Glucose, Bld: 100 mg/dL — ABNORMAL HIGH (ref 70–99)
Potassium: 3.5 mmol/L (ref 3.5–5.1)
Sodium: 137 mmol/L (ref 135–145)

## 2022-03-08 MED ORDER — HYDROCODONE-ACETAMINOPHEN 5-325 MG PO TABS
2.0000 | ORAL_TABLET | Freq: Once | ORAL | Status: AC
  Administered 2022-03-08: 2 via ORAL
  Filled 2022-03-08: qty 2

## 2022-03-08 MED ORDER — HYDROCODONE-ACETAMINOPHEN 5-325 MG PO TABS: 1.0000 | ORAL_TABLET | Freq: Four times a day (QID) | ORAL | 0 refills | Status: DC | PRN

## 2022-03-08 NOTE — ED Provider Triage Note (Signed)
Emergency Medicine Provider Triage Evaluation Note  Mark Bright , a 42 y.o. male  was evaluated in triage.  Pt complains of right testicle pain that started a week ago.  Patient states he felt like his right testicle has drawn in.  He also noticed a bulge on the top of his right testicle today.  He has severe right testicular pain that radiates to his suprapubic and both of his legs.  No fever, nausea, vomiting.  Reports increased urinary frequency.  Review of Systems  Positive: As above Negative: As above  Physical Exam  BP 115/76 (BP Location: Left Arm)   Pulse 71   Temp 97.7 F (36.5 C) (Oral)   Resp 17   SpO2 96%  Gen:   Awake, no distress   Resp:  Normal effort  MSK:   Moves extremities without difficulty  Other:  Tenderness to palpation to right testicle.  High riding R testicle.  Caryl Pina nurse tech in the room during genital examination.  Medical Decision Making  Medically screening exam initiated at 4:58 PM.  Appropriate orders placed.  Mark Bright was informed that the remainder of the evaluation will be completed by another provider, this initial triage assessment does not replace that evaluation, and the importance of remaining in the ED until their evaluation is complete.     Rex Kras, [place] 03/09/22 [phone]

## 2022-03-08 NOTE — ED Triage Notes (Signed)
Pt arrived via POV, c/o numbness and tingling in both legs, as well as worsening groin pain radiating up to abdomen. States pain in right testicle is worsening today.

## 2022-03-08 NOTE — Discharge Instructions (Signed)
You likely have small inguinal hernia.  Please avoid lifting heavy weights.  Take Tylenol for pain and Norco for severe pain.  Please follow-up with surgery outpatient  Return to ER if you have worse inguinal pain or testicular pain or vomiting or worsening numbness or weakness.

## 2022-03-08 NOTE — ED Provider Notes (Signed)
[facility] DEPT Provider Note   CSN: [id number] Arrival date & time: 03/08/22  1533     History  Chief Complaint  Patient presents with   Leg Pain    Mark Bright is a 42 y.o. male here with right testicular pain and numbness in the legs.  This happened about a week ago.  Patient initially went to urgent care and was thought to have sciatica.  Patient then went to the ER and had CT lumbar spine that was unremarkable.  Patient is on a steroid taper.  He states that he has pain radiate down to the right testicle.  He states it is worse with coughing and he felt a bulge there.  He states that he is very active all the time picks up heavy items.  He states that the numbness is worse when he felt a bulge in his right groin area.  Denies any flank pain.  The history is provided by the patient.       Home Medications Prior to Admission medications   Medication Sig Start Date End Date Taking? Authorizing Provider  cyclobenzaprine (FLEXERIL) 10 MG tablet Take 1 tablet (10 mg total) by mouth 3 (three) times daily as needed for muscle spasms. Do Not drink alcohol or drive while taking this medication.  May cause drowsiness. 11/13/21   Volney American, PA-C  gabapentin (NEURONTIN) 100 MG capsule Take 1 capsule (100 mg total) by mouth 3 (three) times daily. 03/04/22   Theressa Stamps R, PA  Multiple Vitamin (MULTIVITAMIN) tablet Take 1 tablet by mouth daily.    [provider]  omeprazole (PRILOSEC) 40 MG capsule Take 1 capsule 30 minutes prior to breakfast meal each day. 04/21/21   Sharyn Creamer, MD  predniSONE (STERAPRED UNI-PAK 21 TAB) 10 MG (21) TBPK tablet Take by mouth daily. Take 6 tabs by mouth daily  for 2 days, then 5 tabs for 2 days, then 4 tabs for 2 days, then 3 tabs for 2 days, 2 tabs for 2 days, then 1 tab by mouth daily for 2 days 03/03/22   Melynda Ripple, NP      Allergies    Novocain [procaine] and Penicillins    Review of Systems    Review of Systems  Genitourinary:  Positive for testicular pain.  All other systems reviewed and are negative.   Physical Exam Updated Vital Signs BP (!) 118/99 (BP Location: Left Arm)   Pulse 69   Temp (!) 97.4 F (36.3 C) (Oral)   Resp 20   SpO2 99%  Physical Exam Vitals and nursing note reviewed.  HENT:     Head: Normocephalic.     Nose: Nose normal.     Mouth/Throat:     Mouth: Mucous membranes are moist.  Eyes:     Extraocular Movements: Extraocular movements intact.     Pupils: Pupils are equal, round, and reactive to light.  Cardiovascular:     Rate and Rhythm: Normal rate and regular rhythm.     Pulses: Normal pulses.     Heart sounds: Normal heart sounds.  Pulmonary:     Effort: Pulmonary effort is normal.     Breath sounds: Normal breath sounds.  Abdominal:     General: Abdomen is flat.     Palpations: Abdomen is soft.     Comments: No CVAT.  Questionable right inguinal hernia that is easily reducible  Genitourinary:    Comments: No testicular tenderness.  No penile lesions.  No inguinal lymphadenopathy Musculoskeletal:        General: Normal range of motion.     Cervical back: Normal range of motion and neck supple.  Skin:    General: Skin is warm.     Capillary Refill: Capillary refill takes less than 2 seconds.  Neurological:     General: No focal deficit present.     Mental Status: He is alert and oriented to person, place, and time.  Psychiatric:        Mood and Affect: Mood normal.        Behavior: Behavior normal.     ED Results / Procedures / Treatments   Labs (all labs ordered are listed, but only abnormal results are displayed) Labs Reviewed  CBC - Abnormal; Notable for the following components:      Result Value   WBC 12.9 (*)    All other components within normal limits  BASIC METABOLIC PANEL - Abnormal; Notable for the following components:   Glucose, Bld 100 (*)    All other components within normal limits  URINALYSIS, ROUTINE W  REFLEX MICROSCOPIC    EKG None  Radiology US SCROTUM W/DOPPLER  Result Date: 03/08/2022 CLINICAL DATA:  Pain EXAM: SCROTAL ULTRASOUND DOPPLER ULTRASOUND OF THE TESTICLES TECHNIQUE: Complete ultrasound examination of the testicles, epididymis, and other scrotal structures was performed. Color and spectral Doppler ultrasound were also utilized to evaluate blood flow to the testicles. COMPARISON:  11/01/2011 ultrasound FINDINGS: Right testicle Measurements: 5.4 x 2.4 x 2.9. No mass or microlithiasis visualized. Left testicle Measurements: 4.7 x 2.1 x 2.9. No mass or microlithiasis visualized. Right epididymis:  Normal in size and appearance. Left epididymis:  Normal in size and appearance. Hydrocele:  None visualized. Varicocele:  None visualized. Pulsed Doppler interrogation of both testes demonstrates normal low resistance arterial and venous waveforms bilaterally. IMPRESSION: Unremarkable testicular ultrasound. Preserved echotexture and blood flow. Electronically Signed   By: Jill Side M.D.   On: 03/08/2022 18:09    Procedures Procedures    Medications Ordered in ED Medications  HYDROcodone-acetaminophen (NORCO/VICODIN) 5-325 MG per tablet 2 tablet (has no administration in time range)    ED Course/ Medical Decision Making/ A&P                           Medical Decision Making Mark Bright is a 42 y.o. male here presenting with right inguinal pain.  Patient was evaluated previously was thought to have sciatica and already on steroid taper.  Patient appears to have right inguinal hernia that is easily reducible.  No tenderness on testicular exam.  CBC and BMP and urinalysis unremarkable.  Ultrasound showed no torsion.  I think likely has a small inguinal hernia.  Will discharge patient home with pain medicine and surgery referral.    Problems Addressed: Non-recurrent unilateral inguinal hernia without obstruction or gangrene: acute illness or injury  Amount and/or Complexity of  Data Reviewed Labs: ordered. Decision-making details documented in ED Course. Radiology: ordered and independent interpretation performed. Decision-making details documented in ED Course.  Risk Prescription drug management.    Final Clinical Impression(s) / ED Diagnoses Final diagnoses:  None    Rx / DC Orders ED Discharge Orders     None         Drenda Freeze, MD 03/08/22 2030

## 2022-03-17 ENCOUNTER — Ambulatory Visit: Payer: Self-pay | Admitting: Surgery

## 2022-03-18 ENCOUNTER — Ambulatory Visit: Payer: Self-pay | Admitting: Surgery

## 2022-03-18 DIAGNOSIS — K4090 Unilateral inguinal hernia, without obstruction or gangrene, not specified as recurrent: Secondary | ICD-10-CM

## 2022-03-18 NOTE — H&P (Signed)
Subjective   Chief Complaint: New Consultation (Unilateral Inguinal Hernia )     History of Present Illness: Mark Bright is a 42 y.o. male who is seen today as an office consultation at the request of Dr. Shirlyn Goltz  for evaluation of New Consultation (Unilateral Inguinal Hernia ) .   This is a 42 year old male in reasonably good health who presents with several years of some mild discomfort in his right groin.  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  About 2 weeks ago, he had severe pain in his right groin that radiated down into his scrotum.  He also has some back pain.  He was evaluated in the emergency department and was felt to have a right inguinal hernia.  The patient is also scheduled for MRI of his back tomorrow to evaluate the low back pain.  Currently he is using muscle relaxants and gabapentin.  He was on steroids but this has been stopped.  Review of Systems: A complete review of systems was obtained from the patient.  I have reviewed this information and discussed as appropriate with the patient.  See HPI as well for other ROS.  Review of Systems  Constitutional: Negative.   HENT: Negative.    Eyes: Negative.   Respiratory: Negative.    Cardiovascular: Negative.   Gastrointestinal:  Positive for abdominal pain.  Genitourinary: Negative.   Musculoskeletal:  Positive for neck pain.  Skin: Negative.   Neurological: Negative.   Endo/Heme/Allergies: Negative.   Psychiatric/Behavioral: Negative.        Medical History: Past Medical History:  Diagnosis Date   Arthritis    GERD (gastroesophageal reflux disease)     Patient Active Problem List  Diagnosis   Poor dentition   Non-recurrent unilateral inguinal hernia without obstruction or gangrene    History reviewed. No pertinent surgical history.   Allergies  Allergen Reactions   Penicillins Other (See Comments)    Describes black out episode after PCN  injection 15 yrs ago   Procaine Other (See Comments)    And lidocaine  "Feels like my head's tingling"    Current Outpatient Medications on File Prior to Visit  Medication Sig Dispense Refill   cyclobenzaprine (FLEXERIL) 10 MG tablet TAKE 1 TABLET BY MOUTH 3 TIMES DAILY AS NEEDED FOR MUSCLE SPASMS. MAY CAUSE DROWSINESS.     gabapentin (NEURONTIN) 100 MG capsule Take 100 mg by mouth 3 (three) times daily     HYDROcodone-acetaminophen (NORCO) 5-325 mg tablet Take 1 tablet by mouth every 6 (six) hours as needed     omeprazole (PRILOSEC) 40 MG DR capsule TAKE 1 CAPSULE 30 MINUTES PRIOR TO BREAKFAST MEAL EACH DAY.     predniSONE (DELTASONE) 10 mg tablet pack Take by mouth     No current facility-administered medications on file prior to visit.    History reviewed. No pertinent family history.   Social History   Tobacco Use  Smoking Status Former   Years: 11   Types: Cigarettes  Smokeless Tobacco Current     Social History   Socioeconomic History   Marital status: Married  Tobacco Use   Smoking status: Former    Years: 11    Types: Cigarettes   Smokeless tobacco: Current  Substance and Sexual Activity   Alcohol use: Not Currently   Drug use: Never    Objective:    Vitals:   03/17/22 0938 03/17/22 0947  BP: (!) 145/81   Pulse:  80   Temp: 37.2 C (98.9 F)   SpO2: 99%   Weight: 65.8 kg (145 lb)   Height: 170.2 cm (5\' 7" )   PainSc:    2    Body mass index is 22.71 kg/m.  Physical Exam   Constitutional:  WDWN in NAD, conversant, no obvious deformities; lying in bed comfortably Eyes:  Pupils equal, round; sclera anicteric; moist conjunctiva; no lid lag HENT:  Oral mucosa moist; good dentition  Neck:  No masses palpated, trachea midline; no thyromegaly Lungs:  CTA bilaterally; normal respiratory effort CV:  Regular rate and rhythm; no murmurs; extremities well-perfused with no edema Abd:  +bowel sounds, soft, non-tender, no palpable organomegaly; no palpable  hernias GU: Bilateral descended testes, no testicular masses.  Spontaneously reducible right inguinal hernia.  No sign of left inguinal hernia Musc: Normal gait; no apparent clubbing or cyanosis in extremities Lymphatic:  No palpable cervical or axillary lymphadenopathy Skin:  Warm, dry; no sign of jaundice Psychiatric - alert and oriented x 4; calm mood and affect    Assessment and Plan:  Diagnoses and all orders for this visit:  Non-recurrent unilateral inguinal hernia without obstruction or gangrene     Recommend right inguinal hernia repair with mesh.The surgical procedure has been discussed with the patient.  Potential risks, benefits, alternative treatments, and expected outcomes have been explained.  All of the patient's questions at this time have been answered.  The likelihood of reaching the patient's treatment goal is good.  The patient understand the proposed surgical procedure and wishes to proceed.     Priscillia Fouch Jearld Adjutant, MD  03/18/2022 10:22 AM

## 2022-04-11 ENCOUNTER — Other Ambulatory Visit: Payer: Self-pay

## 2022-04-11 ENCOUNTER — Encounter (HOSPITAL_COMMUNITY): Payer: Self-pay

## 2022-04-11 ENCOUNTER — Emergency Department (HOSPITAL_COMMUNITY)
Admission: EM | Admit: 2022-04-11 | Discharge: 2022-04-11 | Disposition: A | Discharge status: Home / Self Care | Payer: 59 | Attending: Emergency Medicine | Admitting: Emergency Medicine

## 2022-04-11 DIAGNOSIS — M546 Pain in thoracic spine: Secondary | ICD-10-CM | POA: Diagnosis present

## 2022-04-11 DIAGNOSIS — M62830 Muscle spasm of back: Secondary | ICD-10-CM | POA: Insufficient documentation

## 2022-04-11 MED ORDER — METHYLPREDNISOLONE 4 MG PO TBPK: ORAL_TABLET | ORAL | 0 refills | Status: DC

## 2022-04-11 MED ORDER — MELOXICAM 15 MG PO TABS: 15.0000 mg | ORAL_TABLET | Freq: Every day | ORAL | 0 refills | Status: AC

## 2022-04-11 MED ORDER — KETOROLAC TROMETHAMINE 15 MG/ML IJ SOLN
15.0000 mg | Freq: Once | INTRAMUSCULAR | Status: AC
  Administered 2022-04-11: 15 mg via INTRAMUSCULAR
  Filled 2022-04-11: qty 1

## 2022-04-11 MED ORDER — CYCLOBENZAPRINE HCL 10 MG PO TABS: 10.0000 mg | ORAL_TABLET | Freq: Two times a day (BID) | ORAL | 0 refills | Status: DC | PRN

## 2022-04-11 NOTE — Discharge Instructions (Addendum)
You were evaluated today for back pain. This seems consistent with muscle spasms. Please follow up with primary care for further evaluation as necessary.  Please call the number listed for help establishing care with a new provider. I have prescribed steroids, a muscle relaxant, and antiinflammatory medicine. Do not take other NSAID medications while taking the meloxicam.  If you develop any life threatening symptoms return to the emergency department

## 2022-04-11 NOTE — ED Provider Notes (Signed)
[place] EMERGENCY DEPARTMENT AT [facility] Provider Note   CSN: [id number] Arrival date & time: 04/11/22  [id number]     History  Chief Complaint  Patient presents with   Back Pain    Mark Bright is a 42 y.o. male.  Patient presents emerged part complaining of back pain in the upper back which began yesterday.  He states it woke him from sleep this morning at 3 AM.  He took a muscle relaxer at 530 this morning, methocarbamol, with little relief.  Pain is reproducible with movement.  Patient states that pain is in the mid thoracic region, running across his back.  He denies any injury or trauma to the area.  He denies any red flag symptoms of back pain such as saddle anesthesia, urinary incontinence, urinary retention, fecal incontinence.  Patient also denies chest pain, shortness of breath, numbness, weakness.  Past medical history includes low back pain, cervical radiculitis, right-sided inguinal hernia  HPI     Home Medications Prior to Admission medications   Medication Sig Start Date End Date Taking? Authorizing Provider  cyclobenzaprine (FLEXERIL) 10 MG tablet Take 1 tablet (10 mg total) by mouth 2 (two) times daily as needed for muscle spasms. 04/11/22  Yes Cherlynn June B, PA-C  gabapentin (NEURONTIN) 100 MG capsule Take 1 capsule (100 mg total) by mouth 3 (three) times daily. 03/04/22   Theressa Stamps R, PA  HYDROcodone-acetaminophen (NORCO/VICODIN) 5-325 MG tablet Take 1 tablet by mouth every 6 (six) hours as needed. 03/08/22   Drenda Freeze, MD  meloxicam (MOBIC) 15 MG tablet Take 1 tablet (15 mg total) by mouth daily. 04/11/22 05/11/22 Yes Dorothyann Peng, PA-C  methylPREDNISolone (MEDROL DOSEPAK) 4 MG TBPK tablet Take as directed per package instructions 04/11/22  Yes Dorothyann Peng, PA-C  Multiple Vitamin (MULTIVITAMIN) tablet Take 1 tablet by mouth daily.    [provider]  omeprazole (PRILOSEC) 40 MG capsule Take 1 capsule 30 minutes prior to  breakfast meal each day. 04/21/21   Sharyn Creamer, MD  predniSONE (STERAPRED UNI-PAK 21 TAB) 10 MG (21) TBPK tablet Take by mouth daily. Take 6 tabs by mouth daily  for 2 days, then 5 tabs for 2 days, then 4 tabs for 2 days, then 3 tabs for 2 days, 2 tabs for 2 days, then 1 tab by mouth daily for 2 days 03/03/22   Melynda Ripple, NP      Allergies    Penicillins and Procaine    Review of Systems   Review of Systems  Musculoskeletal:  Positive for back pain.    Physical Exam Updated Vital Signs BP 112/83 (BP Location: Right Arm)   Pulse 78   Temp 98 F (36.7 C) (Oral)   Resp 16   Wt 70 kg   SpO2 100%   BMI 24.17 kg/m  Physical Exam HENT:     Head: Normocephalic and atraumatic.  Eyes:     Conjunctiva/sclera: Conjunctivae normal.  Cardiovascular:     Rate and Rhythm: Normal rate.  Pulmonary:     Effort: Pulmonary effort is normal. No respiratory distress.  Musculoskeletal:        General: No swelling, tenderness, deformity or signs of injury. Normal range of motion.     Cervical back: Normal range of motion.  Skin:    General: Skin is dry.  Neurological:     Mental Status: He is alert.  Psychiatric:        Speech: Speech  normal.        Behavior: Behavior normal.     ED Results / Procedures / Treatments   Labs (all labs ordered are listed, but only abnormal results are displayed) Labs Reviewed - No data to display  EKG None  Radiology No results found.  Procedures Procedures    Medications Ordered in ED Medications  ketorolac (TORADOL) 15 MG/ML injection 15 mg (15 mg Intramuscular Given 04/11/22 0757)    ED Course/ Medical Decision Making/ A&P                             Medical Decision Making Risk Prescription drug management.   This patient presents to the ED for concern of back pain, this involves an extensive number of treatment options, and is a complaint that carries with it a high risk of complications and morbidity.  The differential  diagnosis includes fracture, dislocation, muscle spasms, muscle strain, other soft tissue injuries, cardiac etiology, others   Co morbidities that complicate the patient evaluation  History of low back pain, cervical radiculitis   Additional history obtained:   External records from outside source obtained and reviewed including emergency department notes from January with the patient was evaluated for acute bilateral low back pain   Lab Tests:  There is no indication at this time for laboratory testing   Imaging Studies ordered:  The patient's back pain is been ongoing for 1 day.  There was no trauma.  There are no red flag symptoms.  There is medication at this time for imaging  Problem List / ED Course / Critical interventions / Medication management  I ordered medication including toradol for inflammation  Reevaluation of the patient after these medicines showed that the patient improved I have reviewed the patients home medicines and have made adjustments as needed   Social Determinants of Health:  Patient currently has no PCP   Test / Admission - Considered:  Patient's pain seems musculoskeletal in nature. He has no midline spinal tenderness. There are no neurologic deficits. There was no recent trauma. He is not complaining of chest pain, abdominal pain, shortness of breath. Plan to discharge patient with meloxicam, flexeril, medrol dose pak. Patient instructed to follow up with primary care. Patient instructed on number to help find new PCP. Return precautions provided        Final Clinical Impression(s) / ED Diagnoses Final diagnoses:  Muscle spasm of back    Rx / DC Orders ED Discharge Orders          Ordered    cyclobenzaprine (FLEXERIL) 10 MG tablet  2 times daily PRN        04/11/22 0858    meloxicam (MOBIC) 15 MG tablet  Daily        04/11/22 0858    methylPREDNISolone (MEDROL DOSEPAK) 4 MG TBPK tablet        04/11/22 0858               Dorothyann Peng, PA-C 04/11/22 [id number]    Blanchie Dessert, MD 04/11/22 1817

## 2022-04-11 NOTE — ED Triage Notes (Signed)
C/o upper back pain radiating into shoulder blades x1 day, woke from sleep at 0300 with pain.  Muscle relaxer at home w/o relief.

## 2022-11-30 ENCOUNTER — Ambulatory Visit: Payer: Self-pay | Admitting: Surgery

## 2022-11-30 NOTE — H&P (Signed)
Subjective    Chief Complaint: New Consultation (Unilateral Inguinal Hernia )       History of Present Illness: Mark Bright is a 42 y.o. male who is seen today as an office consultation at the request of Dr. Chaney Malling  for evaluation of New Consultation (Unilateral Inguinal Hernia ) .   This is a 42 year old male in reasonably good health who presents with several years of some mild discomfort in his right groin.  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  About 2 weeks ago, he had severe pain in his right groin that radiated down into his scrotum.  He also has some back pain.  He was evaluated in the emergency department and was felt to have a right inguinal hernia.  The patient is also scheduled for MRI of his back tomorrow to evaluate the low back pain.  Currently he is using muscle relaxants and gabapentin.  He was on steroids but this has been stopped.   Review of Systems: A complete review of systems was obtained from the patient.  I have reviewed this information and discussed as appropriate with the patient.  See HPI as well for other ROS.   Review of Systems  Constitutional: Negative.   HENT: Negative.    Eyes: Negative.   Respiratory: Negative.    Cardiovascular: Negative.   Gastrointestinal:  Positive for abdominal pain.  Genitourinary: Negative.   Musculoskeletal:  Positive for neck pain.  Skin: Negative.   Neurological: Negative.   Endo/Heme/Allergies: Negative.   Psychiatric/Behavioral: Negative.          Medical History:     Past Medical History:  Diagnosis Date   Arthritis     GERD (gastroesophageal reflux disease)           Patient Active Problem List  Diagnosis   Poor dentition   Non-recurrent unilateral inguinal hernia without obstruction or gangrene      History reviewed. No pertinent surgical history.         Allergies  Allergen Reactions   Penicillins Other (See Comments)       Describes black out episode after PCN injection 15 yrs ago   Procaine Other (See Comments)      And lidocaine  "Feels like my head's tingling"            Current Outpatient Medications on File Prior to Visit  Medication Sig Dispense Refill   cyclobenzaprine (FLEXERIL) 10 MG tablet TAKE 1 TABLET BY MOUTH 3 TIMES DAILY AS NEEDED FOR MUSCLE SPASMS. MAY CAUSE DROWSINESS.       gabapentin (NEURONTIN) 100 MG capsule Take 100 mg by mouth 3 (three) times daily       HYDROcodone-acetaminophen (NORCO) 5-325 mg tablet Take 1 tablet by mouth every 6 (six) hours as needed       omeprazole (PRILOSEC) 40 MG DR capsule TAKE 1 CAPSULE 30 MINUTES PRIOR TO BREAKFAST MEAL EACH DAY.       predniSONE (DELTASONE) 10 mg tablet pack Take by mouth        No current facility-administered medications on file prior to visit.      History reviewed. No pertinent family history.    Social History        Tobacco Use  Smoking Status Former   Years: 11   Types: Cigarettes  Smokeless Tobacco Current      Social History         Socioeconomic History  Marital status: Married  Tobacco Use   Smoking status: Former      Years: 11      Types: Cigarettes   Smokeless tobacco: Current  Substance and Sexual Activity   Alcohol use: Not Currently   Drug use: Never      Objective:          Vitals:    03/17/22 0938 03/17/22 0947  BP: (!) 145/81    Pulse: 80    Temp: 37.2 C (98.9 F)    SpO2: 99%    Weight: 65.8 kg (145 lb)    Height: 170.2 cm (5\' 7" )    PainSc:     2    Body mass index is 22.71 kg/m.   Physical Exam    Constitutional:  WDWN in NAD, conversant, no obvious deformities; lying in bed comfortably Eyes:  Pupils equal, round; sclera anicteric; moist conjunctiva; no lid lag HENT:  Oral mucosa moist; good dentition  Neck:  No masses palpated, trachea midline; no thyromegaly Lungs:  CTA bilaterally; normal respiratory effort CV:  Regular rate and rhythm; no murmurs; extremities  well-perfused with no edema Abd:  +bowel sounds, soft, non-tender, no palpable organomegaly; no palpable hernias GU: Bilateral descended testes, no testicular masses.  Spontaneously reducible right inguinal hernia.  No sign of left inguinal hernia Musc: Normal gait; no apparent clubbing or cyanosis in extremities Lymphatic:  No palpable cervical or axillary lymphadenopathy Skin:  Warm, dry; no sign of jaundice Psychiatric - alert and oriented x 4; calm mood and affect       Assessment and Plan:  Diagnoses and all orders for this visit:   Non-recurrent unilateral inguinal hernia without obstruction or gangrene       Recommend right inguinal hernia repair with mesh.The surgical procedure has been discussed with the patient.  Potential risks, benefits, alternative treatments, and expected outcomes have been explained.  All of the patient's questions at this time have been answered.  The likelihood of reaching the patient's treatment goal is good.  The patient understand the proposed surgical procedure and wishes to proceed.    Wilmon Arms. Corliss Skains, MD, [facility]  General Surgery   11/30/2022 1:51 PM

## 2022-12-11 ENCOUNTER — Encounter (HOSPITAL_COMMUNITY): Payer: Self-pay

## 2022-12-11 ENCOUNTER — Other Ambulatory Visit: Payer: Self-pay

## 2022-12-11 ENCOUNTER — Emergency Department (HOSPITAL_COMMUNITY)
Admission: EM | Admit: 2022-12-11 | Discharge: 2022-12-11 | Disposition: A | Discharge status: Home / Self Care | Payer: 59 | Attending: Emergency Medicine | Admitting: Emergency Medicine

## 2022-12-11 DIAGNOSIS — R55 Syncope and collapse: Secondary | ICD-10-CM | POA: Insufficient documentation

## 2022-12-11 DIAGNOSIS — R110 Nausea: Secondary | ICD-10-CM | POA: Diagnosis not present

## 2022-12-11 DIAGNOSIS — K4090 Unilateral inguinal hernia, without obstruction or gangrene, not specified as recurrent: Secondary | ICD-10-CM

## 2022-12-11 DIAGNOSIS — R109 Unspecified abdominal pain: Secondary | ICD-10-CM | POA: Insufficient documentation

## 2022-12-11 LAB — BASIC METABOLIC PANEL
Anion gap: 10 (ref 5–15)
BUN: 11 mg/dL (ref 6–20)
CO2: 22 mmol/L (ref 22–32)
Calcium: 8.9 mg/dL (ref 8.9–10.3)
Chloride: 104 mmol/L (ref 98–111)
Creatinine, Ser: 1 mg/dL (ref 0.61–1.24)
GFR, Estimated: >60 mL/min (ref >60)
Glucose, Bld: 103 mg/dL — ABNORMAL HIGH (ref 70–99)
Potassium: 3.7 mmol/L (ref 3.5–5.1)
Sodium: 136 mmol/L (ref 135–145)

## 2022-12-11 LAB — CBC
HCT: 50.9 % (ref 39.0–52.0)
Hemoglobin: 17.4 g/dL — ABNORMAL HIGH (ref 13.0–17.0)
MCH: 31.6 pg (ref 26.0–34.0)
MCHC: 34.2 g/dL (ref 30.0–36.0)
MCV: 92.5 fL (ref 80.0–100.0)
Platelets: 248 10*3/uL (ref 150–400)
RBC: 5.5 MIL/uL (ref 4.22–5.81)
RDW: 11.4 % — ABNORMAL LOW (ref 11.5–15.5)
WBC: 8 10*3/uL (ref 4.0–10.5)
nRBC: 0 % (ref 0.0–0.2)

## 2022-12-11 LAB — CBG MONITORING, ED: Glucose-Capillary: 124 mg/dL — ABNORMAL HIGH (ref 70–99)

## 2022-12-11 MED ORDER — HYDROCODONE-ACETAMINOPHEN 5-325 MG PO TABS
1.0000 | ORAL_TABLET | Freq: Once | ORAL | Status: AC | PRN
  Administered 2022-12-11: 1 via ORAL
  Filled 2022-12-11: qty 1

## 2022-12-11 MED ORDER — SODIUM CHLORIDE 0.9 % IV BOLUS
1000.0000 mL | Freq: Once | INTRAVENOUS | Status: AC
  Administered 2022-12-11: 1000 mL via INTRAVENOUS

## 2022-12-11 MED ORDER — ONDANSETRON 4 MG PO TBDP: ORAL_TABLET | ORAL | 0 refills | Status: DC

## 2022-12-11 MED ORDER — POLYETHYLENE GLYCOL 3350 17 G PO PACK: 17.0000 g | PACK | Freq: Every day | ORAL | Status: DC | PRN

## 2022-12-11 MED ORDER — KETOROLAC TROMETHAMINE 30 MG/ML IJ SOLN
30.0000 mg | Freq: Once | INTRAMUSCULAR | Status: AC
  Administered 2022-12-11: 30 mg via INTRAVENOUS
  Filled 2022-12-11: qty 1

## 2022-12-11 MED ORDER — HYDROCODONE-ACETAMINOPHEN 5-325 MG PO TABS: 1.0000 | ORAL_TABLET | Freq: Four times a day (QID) | ORAL | 0 refills | Status: DC | PRN

## 2022-12-11 NOTE — Progress Notes (Addendum)
Subjective: CC: Patient is status post open right inguinal hernia repair with ProGrip mesh by Dr. Corliss Skains on 12/07/2022.  He reports on Thursday night after surgery he went to stand and subsequently had an episode of syncope.  This was witnessed.  No head trauma.  He thinks this was secondary to pain.  This again happened on Friday.  He denies any associated dizziness, n/v, chest pain or shortness of breath.  He spent most of the weekend in his recliner to prevent recurrence.  He has been tolerating a diet at home without nausea or vomiting.  Denies any abdominal pain except for pain over his incision site that is worse with movement.  Reports the incision site mainly feels numb. Denies other areas of n/t.  He does have gabapentin at home but has not been taking this.  He was taking oxycodone for the first 2 days after surgery but this gave him headache and made him not feel well so he stopped this.  He has since transition to taking Norco that he had from a prior ED visit as well as Ibuprofen.  He is voiding without difficulties and has voided here in the ED.  Passing flatus.  No BM since surgery.  Workup with: Afebrile. No tachycardia. Bradycardic to 47 which he reports is new. Not on a BB. Soft BP at 99/68 that improved with 1L IVF bolus. Reported negative Orthostatic vital signs.  WBC wnl at 8.0 Hgb 17.4 Cr 1.00, BUN 11 K 3.7, Na 136 Glucose 103 on BMP, 124 on CBG EKG read with sinus rhythm  No imaging has been obtained.   Orthostatic VS for the past 24 hrs:  BP- Lying Pulse- Lying BP- Sitting Pulse- Sitting BP- Standing at 0 minutes Pulse- Standing at 0 minutes  12/11/22 0934 104/79 60 106/66 63 114/84 56      Objective: Vital signs in last 24 hours: Temp:  [97.7 F (36.5 C)-97.9 F (36.6 C)] 97.7 F (36.5 C) (10/14 1132) Pulse Rate:  [47-73] 56 (10/14 1200) Resp:  [15-19] 15 (10/14 1200) BP: (99-116)/(66-85) 105/80 (10/14 1200) SpO2:  [99 %-100 %] 99 % (10/14  1200) Weight:  [70 kg] 70 kg (10/14 0745)    Intake/Output from previous day: No intake/output data recorded. Intake/Output this shift: No intake/output data recorded.  PE: Gen:  Alert, NAD, pleasant HEENT: EOM's intact, pupils equal and round Card:  RRR. Palpable radial and DP pulses b/l.  Pulm:  CTAB, no W/R/R, effort normal Abd: Soft, no distension, appropriately tender around RIH incision - otherwise NT without rigidity or guarding. +BS. R inguinal incision with steri-strips in place, cdi, without drainage, bleeding, obvious hematoma (incision feels almost completely flat) or signs of infection  GU: Chaperone, RN Bri, present for exam. B/l testicles appear descended/do not appear to be high riding. No swelling. No ttp over cord or testicles.  Ext:  No LE edema Psych: A&Ox3  Neuro: Speech clear. Follows commands. No facial droop. PERRL.  EOMI. CN III-XII grossly intact. Grossly moves all extremities 4. Sensation to light touch intact bilaterally for upper and lower.  Skin: no rashes noted, warm and dry  Lab Results:  Recent Labs    12/11/22 0833  WBC 8.0  HGB 17.4*  HCT 50.9  PLT 248   BMET Recent Labs    12/11/22 0833  NA 136  K 3.7  CL 104  CO2 22  GLUCOSE 103*  BUN 11  CREATININE 1.00  CALCIUM 8.9  PT/INR No results for input(s): "LABPROT", "INR" in the last 72 hours. CMP     Component Value Date/Time   NA 136 12/11/2022 0833   NA 141 09/06/2020 1450   K 3.7 12/11/2022 0833   CL 104 12/11/2022 0833   CO2 22 12/11/2022 0833   GLUCOSE 103 (H) 12/11/2022 0833   BUN 11 12/11/2022 0833   BUN 12 09/06/2020 1450   CREATININE 1.00 12/11/2022 0833   CALCIUM 8.9 12/11/2022 0833   PROT 7.3 09/06/2020 1450   ALBUMIN 5.1 (H) 09/06/2020 1450   AST 23 09/06/2020 1450   ALT 21 09/06/2020 1450   ALKPHOS 87 09/06/2020 1450   BILITOT 0.8 09/06/2020 1450   GFRNONAA >60 12/11/2022 0833   GFRAA >60 07/17/2019 1926   Lipase     Component Value Date/Time   LIPASE  61 09/06/2020 1450    Studies/Results: No results found.  Anti-infectives: Anti-infectives (From admission, onward)    None        Assessment/Plan POD 4 s/p open right inguinal hernia repair with pro-grip mesh by Dr. Corliss Skains on 12/07/22  Mark Bright is a 42 y.o. male who is 4 days postop after a open right inguinal hernia repair as stated above.  He presented with syncope and pain over his incision that is worse with movement.  He is tolerating a diet at home without any nausea or vomiting. He is passing flatus at home but has not had a BM yet. He has been able to void since surgery and here in the ED. On exam his abdomen is soft, ND, NT, +BS. His incision appears appropriately ttp, cdi and without drainage or obvious hematoma. He is currently HDS without fever, tachycardia or hypotension. WBC wnl. Hgb 17.4. We did shared decision making about obtaining a CT A/P.  Patient would like to hold off on CT at this time which I think is very reasonable.  From a surgery standpoint I would recommend p.o. challenge in the ED before discharge. Also would like to ensure that he is able to ambulate with staff prior to discharge. Will provide a short course of Norco at d/c as patient states Oxy gives him a HA and he doesn't tolerate it well. Recommended stopping Oxy and maximizing non-narcotic pain medications. Okay for gentle bowel regimen at d/c as well. Discussed return/call back precautions with the patient. Will ensure that he has f/u in our office. I have discussed above recommendations with EDP. If patient is tolerating po, pain well controlled and mobilizing he is okay for d/c from our standpoint. Defer to EDP if patient needs any other workup or specialist consultation for his syncope or bradycardia.    LOS: 0 days    Jacinto Halim , [facility] 12/11/2022, 12:44 PM Please see Amion for pager number during day hours 7:00am-4:30pm

## 2022-12-11 NOTE — Discharge Instructions (Addendum)
Managing Your Pain After Surgery Without Opioids    Thank you for participating in our program to help patients manage their pain after surgery without opioids. This is part of our effort to provide you with the best care possible, without exposing you or your family to the risk that opioids pose.  What pain can I expect after surgery? You can expect to have some pain after surgery. This is normal. The pain is typically worse the day after surgery, and quickly begins to get better. Many studies have found that many patients are able to manage their pain after surgery with Over-the-Counter (OTC) medications such as Tylenol and Motrin. If you have a condition that does not allow you to take Tylenol or Motrin, notify your surgical team.  How will I manage my pain? The best strategy for controlling your pain after surgery is around the clock pain control with Tylenol (acetaminophen) and Motrin (ibuprofen or Advil). Alternating these medications with each other allows you to maximize your pain control. In addition to Tylenol and Motrin, you can use heating pads or ice packs on your incisions to help reduce your pain. To note, Norco/Hydrocodone-acetaminophen also contains Tylenol (acetaminophen). Do not take more than 3000mg  of Tylenol in a 24 hours period.   Important: Do not take more than 3000mg  of Tylenol or 2400mg  of Motrin in a 24-hour period. Do not take ibuprofen/Motrin if you have a history of bleeding stomach ulcers, severe kidney disease, &/or actively taking a blood thinner  What if I still have pain? If you have pain that is not controlled with the over-the-counter pain medications (Tylenol and Motrin or Advil) you might have what we call "breakthrough" pain. You will receive a prescription for a small amount of an opioid pain medication such as Oxycodone, Tramadol, or Tylenol with Codeine. Use these opioid pills in the first 24 hours after surgery if you have breakthrough pain. Do not  take more than 1 pill every 4-6 hours.  If you still have uncontrolled pain after using all opioid pills, don't hesitate to call our staff using the number provided. We will help make sure you are managing your pain in the best way possible, and if necessary, we can provide a prescription for additional pain medication.   Day 1    Time  Name of Medication Number of pills taken  Amount of Acetaminophen  Pain Level   Comments  AM PM       AM PM       AM PM       AM PM       AM PM       AM PM       AM PM       AM PM       Total Daily amount of Acetaminophen Do not take more than  3,000 mg per day      Day 2    Time  Name of Medication Number of pills taken  Amount of Acetaminophen  Pain Level   Comments  AM PM       AM PM       AM PM       AM PM       AM PM       AM PM       AM PM       AM PM       Total Daily amount of Acetaminophen Do not take more than  3,000 mg per day      Day 3    Time  Name of Medication Number of pills taken  Amount of Acetaminophen  Pain Level   Comments  AM PM       AM PM       AM PM       AM PM         AM PM       AM PM       AM PM       AM PM       Total Daily amount of Acetaminophen Do not take more than  3,000 mg per day      Day 4    Time  Name of Medication Number of pills taken  Amount of Acetaminophen  Pain Level   Comments  AM PM       AM PM       AM PM       AM PM       AM PM       AM PM       AM PM       AM PM       Total Daily amount of Acetaminophen Do not take more than  3,000 mg per day      Day 5    Time  Name of Medication Number of pills taken  Amount of Acetaminophen  Pain Level   Comments  AM PM       AM PM       AM PM       AM PM       AM PM       AM PM       AM PM       AM PM       Total Daily amount of Acetaminophen Do not take more than  3,000 mg per day      Day 6    Time  Name of Medication Number of pills taken  Amount of Acetaminophen  Pain Level   Comments  AM PM       AM PM       AM PM       AM PM       AM PM       AM PM       AM PM       AM PM       Total Daily amount of Acetaminophen Do not take more than  3,000 mg per day      Day 7    Time  Name of Medication Number of pills taken  Amount of Acetaminophen  Pain Level   Comments  AM PM       AM PM       AM PM       AM PM       AM PM       AM PM       AM PM       AM PM       Total Daily amount of Acetaminophen Do not take more than  3,000 mg per day        For additional information about how and where to safely dispose of unused opioid medications - [email or web address]  Disclaimer: This document contains information and/or instructional materials adapted from [facility] for the typical patient with your condition. It does not replace medical advice  from your health care provider because your experience may differ from that of the typical patient. Talk to your health care provider if you have any questions about this document, your condition or your treatment plan. Adapted from [facility]   CCS _______[facility]  UMBILICAL OR INGUINAL HERNIA REPAIR: POST OP INSTRUCTIONS  Always review your discharge instruction sheet given to you by the facility where your surgery was performed. IF YOU HAVE DISABILITY OR FAMILY LEAVE FORMS, YOU MUST BRING THEM TO THE OFFICE FOR PROCESSING.   DO NOT GIVE THEM TO YOUR DOCTOR.  1. A  prescription for pain medication may be given to you upon discharge.  Take your pain medication as prescribed, if needed.  If narcotic pain medicine is not needed, then you may take acetaminophen (Tylenol) or ibuprofen (Advil) as needed. 2. Take your usually prescribed medications unless otherwise directed. If you need a refill on your pain medication, please contact your pharmacy.  They will contact our office to request authorization. Prescriptions will not be filled after 5 pm or on  week-ends. 3. You should follow a light diet the first 24 hours after arrival home, such as soup and crackers, etc.  Be sure to include lots of fluids daily.  Resume your normal diet the day after surgery. 4.Most patients will experience some swelling and bruising around the umbilicus or in the groin and scrotum.  Ice packs and reclining will help.  Swelling and bruising can take several days to resolve.  6. It is common to experience some constipation if taking pain medication after surgery.  Increasing fluid intake and taking a stool softener (such as Colace) will usually help or prevent this problem from occurring.  A mild laxative (Milk of Magnesia or Miralax) should be taken according to package directions if there are no bowel movements after 48 hours. 7. Unless discharge instructions indicate otherwise, you may remove your bandages 24-48 hours after surgery, and you may shower at that time.  You may have steri-strips (small skin tapes) in place directly over the incision.  These strips should be left on the skin for 7-10 days.  If your surgeon used skin glue on the incision, you may shower in 24 hours.  The glue will flake off over the next 2-3 weeks.  Any sutures or staples will be removed at the office during your follow-up visit. 8. ACTIVITIES:  You may resume regular (light) daily activities beginning the next day--such as daily self-care, walking, climbing stairs--gradually increasing activities as tolerated.  You may have sexual intercourse when it is comfortable.  Refrain from any heavy lifting or straining until approved by your doctor.  a.You may drive when you are no longer taking prescription pain medication, you can comfortably wear a seatbelt, and you can safely maneuver your car and apply brakes. b.RETURN TO WORK:   _____________________________________________  9.You should see your doctor in the office for a follow-up appointment approximately 2-3 weeks after your surgery.  Make  sure that you call for this appointment within a day or two after you arrive home to insure a convenient appointment time. 10.OTHER INSTRUCTIONS: _________________________    _____________________________________  WHEN TO CALL YOUR DOCTOR: Fever over 101.0 Inability to urinate Nausea and/or vomiting Extreme swelling or bruising Continued bleeding from incision. Increased pain, redness, or drainage from the incision  The clinic staff is available to answer your questions during regular business hours.  Please don't hesitate to call and ask to speak to one of  the nurses for clinical concerns.  If you have a medical emergency, go to the nearest emergency room or call 911.  A surgeon from [facility] is always on call at the hospital   [address], [address], [place], [place]  [address] ?  [address] [address], [place], [place]   [address] [phone] ? [phone] ? FAX [phone] Web site: [email or web address]

## 2022-12-11 NOTE — ED Triage Notes (Signed)
Patient had hernia repair Thursday. Stated every time he stands up he blacks out and feels weak. Stated his surgical site is very painful. Denies drainage from site.

## 2022-12-11 NOTE — ED Provider Notes (Signed)
EMERGENCY DEPARTMENT AT [facility] Provider Note   CSN: [id number] Arrival date & time: 12/11/22  4098     History {Add pertinent medical, surgical, social history, OB history to HPI:1} Chief Complaint  Patient presents with   Loss of Consciousness    Mark Bright is a 42 y.o. male.  Patient states he has had a inguinal hernia repair and has significant pain that he gets nauseated and passes out.   Loss of Consciousness      Home Medications Prior to Admission medications   Medication Sig Start Date End Date Taking? Authorizing Provider  ondansetron (ZOFRAN-ODT) 4 MG disintegrating tablet 4mg  ODT q4 hours prn nausea/vomit 12/11/22  Yes Bethann Berkshire, MD  polyethylene glycol (MIRALAX) 17 g packet Take 17 g by mouth daily as needed. 12/11/22  Yes Maczis, Elmer Sow, PA-C  cyclobenzaprine (FLEXERIL) 10 MG tablet Take 1 tablet (10 mg total) by mouth 2 (two) times daily as needed for muscle spasms. 04/11/22   Darrick Grinder, PA-C  gabapentin (NEURONTIN) 100 MG capsule Take 1 capsule (100 mg total) by mouth 3 (three) times daily. 03/04/22   Clark, Meghan R, PA-C  HYDROcodone-acetaminophen (NORCO/VICODIN) 5-325 MG tablet Take 1 tablet by mouth every 6 (six) hours as needed. 12/11/22   Maczis, Elmer Sow, PA-C  methylPREDNISolone (MEDROL DOSEPAK) 4 MG TBPK tablet Take as directed per package instructions 04/11/22   Darrick Grinder, PA-C  Multiple Vitamin (MULTIVITAMIN) tablet Take 1 tablet by mouth daily.    [provider]  omeprazole (PRILOSEC) 40 MG capsule Take 1 capsule 30 minutes prior to breakfast meal each day. 04/21/21   Imogene Burn, MD  predniSONE (STERAPRED UNI-PAK 21 TAB) 10 MG (21) TBPK tablet Take by mouth daily. Take 6 tabs by mouth daily  for 2 days, then 5 tabs for 2 days, then 4 tabs for 2 days, then 3 tabs for 2 days, 2 tabs for 2 days, then 1 tab by mouth daily for 2 days 03/03/22   Radford Pax, NP      Allergies    Lidocaine,  Penicillins, and Procaine    Review of Systems   Review of Systems  Cardiovascular:  Positive for syncope.    Physical Exam Updated Vital Signs BP 122/79   Pulse (!) 51   Temp 97.7 F (36.5 C) (Oral)   Resp (!) 21   Ht 5\' 7"  (1.702 m)   Wt 70 kg   SpO2 97%   BMI 24.17 kg/m  Physical Exam  ED Results / Procedures / Treatments   Labs (all labs ordered are listed, but only abnormal results are displayed) Labs Reviewed  BASIC METABOLIC PANEL - Abnormal; Notable for the following components:      Result Value   Glucose, Bld 103 (*)    All other components within normal limits  CBC - Abnormal; Notable for the following components:   Hemoglobin 17.4 (*)    RDW 11.4 (*)    All other components within normal limits  CBG MONITORING, ED - Abnormal; Notable for the following components:   Glucose-Capillary 124 (*)    All other components within normal limits    EKG EKG Interpretation Date/Time:  Monday December 11 2022 07:46:25 EDT Ventricular Rate:  80 PR Interval:  159 QRS Duration:  91 QT Interval:  375 QTC Calculation: 433 R Axis:   82  Text Interpretation: Sinus rhythm Confirmed by Bethann Berkshire [phone]) on 12/11/2022 1:08:12 PM  Radiology No  results found.  Procedures Procedures  {Document cardiac monitor, telemetry assessment procedure when appropriate:1}  Medications Ordered in ED Medications  sodium chloride 0.9 % bolus 1,000 mL (0 mLs Intravenous Stopped 12/11/22 0957)  ketorolac (TORADOL) 30 MG/ML injection 30 mg (30 mg Intravenous Given 12/11/22 0834)  HYDROcodone-acetaminophen (NORCO/VICODIN) 5-325 MG per tablet 1 tablet (1 tablet Oral Given 12/11/22 1448)    ED Course/ Medical Decision Making/ A&P   {   Click here for ABCD2, HEART and other calculatorsREFRESH Note before signing :1}                              Medical Decision Making Amount and/or Complexity of Data Reviewed Labs: ordered.  Risk Prescription drug management.   Patient  with syncope secondary to inguinal hernia repair.  He will walk with a walker and is given hydrocodone for pain.  General surgery has seen the patient  {Document critical care time when appropriate:1} {Document review of labs and clinical decision tools ie heart score, Chads2Vasc2 etc:1}  {Document your independent review of radiology images, and any outside records:1} {Document your discussion with family members, caretakers, and with consultants:1} {Document social determinants of health affecting pt's care:1} {Document your decision making why or why not admission, treatments were needed:1} Final Clinical Impression(s) / ED Diagnoses Final diagnoses:  Syncope and collapse    Rx / DC Orders ED Discharge Orders          Ordered    HYDROcodone-acetaminophen (NORCO/VICODIN) 5-325 MG tablet  Every 6 hours PRN        12/11/22 1401    polyethylene glycol (MIRALAX) 17 g packet  Daily PRN        12/11/22 1413    ondansetron (ZOFRAN-ODT) 4 MG disintegrating tablet        12/11/22 1525

## 2022-12-11 NOTE — ED Notes (Signed)
Patient was able to ambulated using a walker a few feet. He reports this being an improvement from before, but did also complain of pain, right side weakness and feeling lightheaded. Patient does not have a walker at home.

## 2023-06-04 ENCOUNTER — Encounter (HOSPITAL_BASED_OUTPATIENT_CLINIC_OR_DEPARTMENT_OTHER): Payer: Self-pay

## 2023-06-04 ENCOUNTER — Emergency Department (HOSPITAL_BASED_OUTPATIENT_CLINIC_OR_DEPARTMENT_OTHER)
Admission: EM | Admit: 2023-06-04 | Discharge: 2023-06-04 | Disposition: A | Discharge status: Home / Self Care | Attending: Emergency Medicine | Admitting: Emergency Medicine

## 2023-06-04 ENCOUNTER — Other Ambulatory Visit: Payer: Self-pay

## 2023-06-04 DIAGNOSIS — S79922A Unspecified injury of left thigh, initial encounter: Secondary | ICD-10-CM | POA: Diagnosis present

## 2023-06-04 DIAGNOSIS — Y990 Civilian activity done for income or pay: Secondary | ICD-10-CM | POA: Diagnosis not present

## 2023-06-04 DIAGNOSIS — M79662 Pain in left lower leg: Secondary | ICD-10-CM

## 2023-06-04 DIAGNOSIS — S7012XA Contusion of left thigh, initial encounter: Secondary | ICD-10-CM | POA: Diagnosis not present

## 2023-06-04 DIAGNOSIS — W228XXA Striking against or struck by other objects, initial encounter: Secondary | ICD-10-CM | POA: Insufficient documentation

## 2023-06-04 LAB — D-DIMER, QUANTITATIVE: D-Dimer, Quant: 0.34 ug{FEU}/mL (ref 0.00–0.50)

## 2023-06-04 MED ORDER — IBUPROFEN 400 MG PO TABS
600.0000 mg | ORAL_TABLET | Freq: Once | ORAL | Status: AC
  Administered 2023-06-04: 600 mg via ORAL
  Filled 2023-06-04: qty 1

## 2023-06-04 NOTE — Discharge Instructions (Signed)
 Your labs did not show any signs of a blood clot today.  You may take up to 1000mg  of tylenol every 6 hours as needed for pain. Do not take more then 4g per day.   You may use up to 600mg  ibuprofen every 6 hours as needed for pain.  Do not exceed 2.4g of ibuprofen per day.  Please follow-up with the orthopedic office listed below if your symptoms are not starting to improve within the next week.  Return to the ER for any acute worsening of your pain, numbness in the left leg or foot, any other new or concerning symptoms.

## 2023-06-04 NOTE — ED Triage Notes (Signed)
 Ran into something last Thursday affecting his upper thigh saw UC on Friday  Xray was negative Has tried ice, heat with no relief Now pain is from knee down to calf and states it's a throbbing pain

## 2023-06-04 NOTE — ED Provider Notes (Signed)
 [place] EMERGENCY DEPARTMENT AT [facility] Provider Note   CSN: [id number] Arrival date & time: 06/04/23  1856     History  Chief Complaint  Patient presents with   Leg Injury   Leg Pain    Mark Bright is a 43 y.o. male with no significant past medical history presents with concern for left calf pain that started suddenly this morning.  States the pain is like a shooting/stabbing sensation that goes down from the knee to his Achilles.  He does report that 5 days ago he ran into a water spigot at work with his left thigh, and that pain has resolved, but the calf pain just started.  He denies any history of a blood clot, not on any blood thinners.  Denies any numbness or tingling in the left lower extremity.   Leg Pain      Home Medications Prior to Admission medications   Medication Sig Start Date End Date Taking? Authorizing Provider  cyclobenzaprine (FLEXERIL) 10 MG tablet Take 1 tablet (10 mg total) by mouth 2 (two) times daily as needed for muscle spasms. 04/11/22   Darrick Grinder, PA-C  gabapentin (NEURONTIN) 100 MG capsule Take 1 capsule (100 mg total) by mouth 3 (three) times daily. 03/04/22   Clark, Meghan R, PA-C  HYDROcodone-acetaminophen (NORCO/VICODIN) 5-325 MG tablet Take 1 tablet by mouth every 6 (six) hours as needed. 12/11/22   Maczis, Elmer Sow, PA-C  methylPREDNISolone (MEDROL DOSEPAK) 4 MG TBPK tablet Take as directed per package instructions 04/11/22   Darrick Grinder, PA-C  Multiple Vitamin (MULTIVITAMIN) tablet Take 1 tablet by mouth daily.    [provider]  omeprazole (PRILOSEC) 40 MG capsule Take 1 capsule 30 minutes prior to breakfast meal each day. 04/21/21   Imogene Burn, MD  ondansetron (ZOFRAN-ODT) 4 MG disintegrating tablet 4mg  ODT q4 hours prn nausea/vomit 12/11/22   Bethann Berkshire, MD  polyethylene glycol (MIRALAX) 17 g packet Take 17 g by mouth daily as needed. 12/11/22   Maczis, Elmer Sow, PA-C  predniSONE (STERAPRED  UNI-PAK 21 TAB) 10 MG (21) TBPK tablet Take by mouth daily. Take 6 tabs by mouth daily  for 2 days, then 5 tabs for 2 days, then 4 tabs for 2 days, then 3 tabs for 2 days, 2 tabs for 2 days, then 1 tab by mouth daily for 2 days 03/03/22   Radford Pax, NP      Allergies    Lidocaine, Penicillins, and Procaine    Review of Systems   Review of Systems  Skin:  Negative for wound.    Physical Exam Updated Vital Signs BP 109/74   Pulse (!) 58   Temp 97.9 F (36.6 C)   Resp 19   Ht 5\' 7"  (1.702 m)   Wt 72.6 kg   SpO2 98%   BMI 25.06 kg/m  Physical Exam Vitals and nursing note reviewed.  Constitutional:      Appearance: Normal appearance.  HENT:     Head: Atraumatic.  Cardiovascular:     Rate and Rhythm: Regular rhythm.     Comments: 2+ dorsalis pedis pulse bilaterally Pulmonary:     Effort: Pulmonary effort is normal.     Breath sounds: Normal breath sounds.  Musculoskeletal:     Comments: Left lower extremity:  General Contusion to the left upper anterior thigh.  No obvious deformity. No erythema, edema or open wounds.  Calves appear symmetric bilaterally without obvious edema of the left calf.  Palpation Mild tenderness to palpation in the distal calf.  Calf is soft. Non tender over the femur Nontender along the tibia and fibula, patella, MCL, LCL Nontender on the lateral and medial malleolus Non-tender of the popliteal fossa  ROM Full knee flexion and extension  Sensation: Sensation intact throughout the lower extremity  Strength: 5/5 strength with resisted knee flexion and extension  5/5 strength with resisted ankle plantarflexion and dorsiflexion   Neurological:     General: No focal deficit present.     Mental Status: He is alert.  Psychiatric:        Mood and Affect: Mood normal.        Behavior: Behavior normal.     ED Results / Procedures / Treatments   Labs (all labs ordered are listed, but only abnormal results are displayed) Labs Reviewed   D-DIMER, QUANTITATIVE    EKG None  Radiology No results found.  Procedures Procedures    Medications Ordered in ED Medications  ibuprofen (ADVIL) tablet 600 mg (600 mg Oral Given 06/04/23 2053)    ED Course/ Medical Decision Making/ A&P                                 Medical Decision Making Amount and/or Complexity of Data Reviewed Labs: ordered.     Differential diagnosis includes but is not limited to musculoskeletal pain, radicular pain, DVT, sprain   ED Course:  Upon initial evaluation, patient is well-appearing, stable vital signs.  He has a contusion of the left upper anterior thigh, but states this is not what is bothering him today.  He is reporting pain in his left calf.  No obvious deformity, erythema, or edema of the left calf.  No overlying wounds or redness to suggest infection.  Calves appear symmetric bilaterally.  He has mild tenderness palpation of the left lower calf.  Full range of motion of the knees and ankles bilaterally.  Neurovascularly intact in the bilateral lower extremities.  Able to ambulate, but with antalgic gait.   He did not have any acute injuries or falls, no point tenderness, low concern for fracture or dislocation.  No indication for x-ray imaging.  Pain does not shoot down the leg, does not sound radicular.  He states he already tried prednisone at home which did not help with his symptoms.  D-dimer within normal limits, low concern for DVT. Unclear etiology to his symptoms at this time, but low concern for emergent pathology.  Labs Ordered: I Ordered, and personally interpreted labs.  The pertinent results include:   D-dimer within normal limits  Medications Given: Ibuprofen   Impression: Left calf pain  Disposition:  The patient was discharged home with instructions to take Tylenol and ibuprofen as needed for pain.  Follow-up with orthopedics if symptoms not improved within the next week Return precautions given.     This  chart was dictated using voice recognition software, Dragon. Despite the best efforts of this provider to proofread and correct errors, errors may still occur which can change documentation meaning.          Final Clinical Impression(s) / ED Diagnoses Final diagnoses:  Pain of left calf    Rx / DC Orders ED Discharge Orders     None         Arabella Merles, Cordelia Poche 06/04/23 2207    Alvira Monday, MD 06/05/23 1055

## 2023-06-28 ENCOUNTER — Ambulatory Visit
Admission: RE | Admit: 2023-06-28 | Discharge: 2023-06-28 | Disposition: A | Discharge status: Home / Self Care | Source: Ambulatory Visit

## 2023-06-28 VITALS — BP 116/77 | HR 76 | Temp 97.6°F | Resp 17

## 2023-06-28 DIAGNOSIS — H6992 Unspecified Eustachian tube disorder, left ear: Secondary | ICD-10-CM | POA: Diagnosis not present

## 2023-06-28 NOTE — ED Triage Notes (Addendum)
 Pt c/o ear pain and fullness for 3 days. He has been using ear drops at home and took two pills of amoxicillin that he had at home.

## 2023-06-28 NOTE — Discharge Instructions (Addendum)
  1. Eustachian tube dysfunction, left (Primary) - Ambulatory referral to ENT for follow-up evaluation of left ear pressure - Continue taking ibuprofen  600 to 800 mg every 6-8 hours for inflammation and pain secondary to eustachian tube dysfunction. - If symptoms become more profound or you develop new symptoms follow-up in ER for further evaluation and management.

## 2023-06-28 NOTE — ED Provider Notes (Signed)
 [facility]  Note:  This document was prepared using Dragon voice recognition software and may include unintentional dictation errors.  MRN: [id number] DOB: 1981-01-09  Subjective:   Mark Bright is a 43 y.o. male presenting for left-sided ear pressure x 3 days that seems to be getting worse.  Patient reports he has been using over-the-counter eardrops and took 2 doses of amoxicillin that he had at home with no improvement.  Patient has also been taking ibuprofen  but states that symptoms seem to be getting more severe.  Patient denies any drainage from ears, cough, nasal congestion, headache.  States that everything sounds like it is underwater and has intermittent ringing in his ears.  No current facility-administered medications for this encounter.  Current Outpatient Medications:    cyclobenzaprine  (FLEXERIL ) 10 MG tablet, Take 1 tablet (10 mg total) by mouth 2 (two) times daily as needed for muscle spasms., Disp: 20 tablet, Rfl: 0   gabapentin  (NEURONTIN ) 100 MG capsule, Take 1 capsule (100 mg total) by mouth 3 (three) times daily., Disp: 30 capsule, Rfl: 0   HYDROcodone -acetaminophen  (NORCO/VICODIN) 5-325 MG tablet, Take 1 tablet by mouth every 6 (six) hours as needed., Disp: 15 tablet, Rfl: 0   methylPREDNISolone  (MEDROL  DOSEPAK) 4 MG TBPK tablet, Take as directed per package instructions, Disp: 21 tablet, Rfl: 0   Multiple Vitamin (MULTIVITAMIN) tablet, Take 1 tablet by mouth daily., Disp: , Rfl:    omeprazole  (PRILOSEC) 40 MG capsule, Take 1 capsule 30 minutes prior to breakfast meal each day., Disp: 90 capsule, Rfl: 3   ondansetron  (ZOFRAN -ODT) 4 MG disintegrating tablet, 4mg  ODT q4 hours prn nausea/vomit, Disp: 12 tablet, Rfl: 0   polyethylene glycol (MIRALAX ) 17 g packet, Take 17 g by mouth daily as needed., Disp: , Rfl:    predniSONE  (STERAPRED UNI-PAK 21 TAB) 10 MG (21) TBPK tablet, Take by mouth daily. Take 6 tabs by mouth daily  for 2 days, then 5 tabs  for 2 days, then 4 tabs for 2 days, then 3 tabs for 2 days, 2 tabs for 2 days, then 1 tab by mouth daily for 2 days, Disp: 42 tablet, Rfl: 0   Allergies  Allergen Reactions   Lidocaine     Penicillins Other (See Comments)    Describes black out episode after PCN injection 15 yrs ago   Procaine Other (See Comments)    And lidocaine   "Feels like my head's tingling"  And lidocaine  "Feels like my head's tingling"    History reviewed. No pertinent past medical history.   Past Surgical History:  Procedure Laterality Date   HERNIA REPAIR     NO PAST SURGERIES      Family History  Problem Relation Age of Onset   COPD Mother    Arthritis Mother    Hypertension Mother    Diabetes Brother     Social History   Tobacco Use   Smoking status: Former    Current packs/day: 0.50    Types: E-cigarettes, Cigarettes   Smokeless tobacco: Never  Vaping Use   Vaping status: Every Day   Substances: Flavoring  Substance Use Topics   Alcohol use: No   Drug use: No    ROS Refer to HPI for ROS details.  Objective:    Vitals: BP 116/77 (BP Location: Right Arm)   Pulse 76   Temp 97.6 F (36.4 C) (Oral)   Resp 17   SpO2 97%   Physical Exam Vitals and nursing note reviewed.  Constitutional:  General: He is not in acute distress.    Appearance: Normal appearance. He is well-developed. He is not ill-appearing or toxic-appearing.  HENT:     Head: Normocephalic and atraumatic.     Right Ear: Ear canal and external ear normal. No middle ear effusion. There is no impacted cerumen. Tympanic membrane is not injected, erythematous or bulging.     Left Ear: Ear canal and external ear normal. A middle ear effusion is present. There is no impacted cerumen. Tympanic membrane is not injected, erythematous or bulging.     Nose: Nose normal.     Mouth/Throat:     Mouth: Mucous membranes are moist.  Eyes:     General:        Right eye: No discharge.        Left eye: No discharge.      Extraocular Movements: Extraocular movements intact.     Conjunctiva/sclera: Conjunctivae normal.  Cardiovascular:     Rate and Rhythm: Normal rate.  Pulmonary:     Effort: Pulmonary effort is normal. No respiratory distress.  Skin:    General: Skin is warm and dry.  Neurological:     General: No focal deficit present.     Mental Status: He is alert and oriented to person, place, and time.  Psychiatric:        Mood and Affect: Mood normal.        Behavior: Behavior normal.     Procedures  No results found for this or any previous visit (from the past 24 hours).  Assessment and Plan :     Discharge Instructions       1. Eustachian tube dysfunction, left (Primary) - Ambulatory referral to ENT for follow-up evaluation of left ear pressure - Continue taking ibuprofen  600 to 800 mg every 6-8 hours for inflammation and pain secondary to eustachian tube dysfunction. - If symptoms become more profound or you develop new symptoms follow-up in ER for further evaluation and management.      Kivon Aprea B Jiraiya Mcewan   Andres Escandon B, [place] 06/28/23 1529

## 2023-07-30 ENCOUNTER — Other Ambulatory Visit: Payer: Self-pay

## 2023-07-30 ENCOUNTER — Emergency Department (HOSPITAL_COMMUNITY)
Admission: EM | Admit: 2023-07-30 | Discharge: 2023-07-30 | Disposition: A | Discharge status: Home / Self Care | Attending: Student | Admitting: Student

## 2023-07-30 ENCOUNTER — Emergency Department (HOSPITAL_COMMUNITY)

## 2023-07-30 ENCOUNTER — Encounter (HOSPITAL_COMMUNITY): Payer: Self-pay

## 2023-07-30 DIAGNOSIS — R0789 Other chest pain: Secondary | ICD-10-CM

## 2023-07-30 DIAGNOSIS — Z87891 Personal history of nicotine dependence: Secondary | ICD-10-CM | POA: Diagnosis not present

## 2023-07-30 DIAGNOSIS — K2940 Chronic atrophic gastritis without bleeding: Secondary | ICD-10-CM | POA: Insufficient documentation

## 2023-07-30 LAB — TROPONIN I (HIGH SENSITIVITY)
Troponin I (High Sensitivity): 3 ng/L
Troponin I (High Sensitivity): <2 ng/L

## 2023-07-30 LAB — BASIC METABOLIC PANEL WITH GFR
Anion gap: 10 (ref 5–15)
BUN: 9 mg/dL (ref 6–20)
CO2: 25 mmol/L (ref 22–32)
Calcium: 9.9 mg/dL (ref 8.9–10.3)
Chloride: 105 mmol/L (ref 98–111)
Creatinine, Ser: 0.99 mg/dL (ref 0.61–1.24)
GFR, Estimated: >60 mL/min (ref >60)
Glucose, Bld: 103 mg/dL — ABNORMAL HIGH (ref 70–99)
Potassium: 3.8 mmol/L (ref 3.5–5.1)
Sodium: 140 mmol/L (ref 135–145)

## 2023-07-30 LAB — CBC
HCT: 44.7 % (ref 39.0–52.0)
Hemoglobin: 15.3 g/dL (ref 13.0–17.0)
MCH: 31.9 pg (ref 26.0–34.0)
MCHC: 34.2 g/dL (ref 30.0–36.0)
MCV: 93.3 fL (ref 80.0–100.0)
Platelets: 284 K/uL (ref 150–400)
RBC: 4.79 MIL/uL (ref 4.22–5.81)
RDW: 11.9 % (ref 11.5–15.5)
WBC: 9 K/uL (ref 4.0–10.5)
nRBC: 0 % (ref 0.0–0.2)

## 2023-07-30 MED ORDER — LACTATED RINGERS IV BOLUS
1000.0000 mL | Freq: Once | INTRAVENOUS | Status: AC
  Administered 2023-07-30: 1000 mL via INTRAVENOUS

## 2023-07-30 MED ORDER — OMEPRAZOLE 20 MG PO CPDR: 20.0000 mg | DELAYED_RELEASE_CAPSULE | Freq: Every day | ORAL | 0 refills | Status: DC

## 2023-07-30 MED ORDER — ALUM & MAG HYDROXIDE-SIMETH 200-200-20 MG/5ML PO SUSP
30.0000 mL | Freq: Once | ORAL | Status: AC
  Administered 2023-07-30: 30 mL via ORAL
  Filled 2023-07-30: qty 30

## 2023-07-30 MED ORDER — MAALOX MAX 400-400-40 MG/5ML PO SUSP: 15.0000 mL | Freq: Four times a day (QID) | ORAL | 0 refills | Status: AC | PRN

## 2023-07-30 MED ORDER — FAMOTIDINE IN NACL 20-0.9 MG/50ML-% IV SOLN
20.0000 mg | Freq: Once | INTRAVENOUS | Status: AC
  Administered 2023-07-30: 20 mg via INTRAVENOUS
  Filled 2023-07-30: qty 50

## 2023-07-30 NOTE — ED Provider Notes (Signed)
  EMERGENCY DEPARTMENT AT [facility] Provider Note  CSN: [id number] Arrival date & time: 07/30/23 1420  Chief Complaint(s) Chest Pain  HPI Mark Bright is a 43 y.o. male who presents emergency room for evaluation of chest pain.  States that chest pain began last night worse in the epigastrium radiating up into the left chest.  Endorses a burning sensation and denies any external complete pain, denies associated shortness of breath, diaphoresis, nausea or vomiting.  Patient states he took 3 Alka-Seltzer antacid tablets last night without improvement.  Denies associated  headache, fever, diarrhea or other systemic symptoms.  States that he previously was on a PPI but has since discontinued this medication because he had been doing well.  Also incidentally endorses a sensation that since foods get stuck in his lower esophagus.  Currently he is able to tolerate secretions without difficulty in the ER.   Past Medical History History reviewed. No pertinent past medical history. Patient Active Problem List   Diagnosis Date Noted   Right inguinal hernia status post open repair with mesh 12/07/2022 03/18/2022   Poor dentition 04/17/2016   Home Medication(s) Prior to Admission medications   Medication Sig Start Date End Date Taking? Authorizing Provider  alum & mag hydroxide-simeth (MAALOX MAX) 400-400-40 MG/5ML suspension Take 15 mLs by mouth every 6 (six) hours as needed for indigestion. 07/30/23  Yes Khalil Szczepanik, MD  omeprazole  (PRILOSEC) 20 MG capsule Take 1 capsule (20 mg total) by mouth daily. 07/30/23 08/29/23 Yes Laneisha Mino, MD  cyclobenzaprine  (FLEXERIL ) 10 MG tablet Take 1 tablet (10 mg total) by mouth 2 (two) times daily as needed for muscle spasms. 04/11/22   Elisa Guest, PA-C  gabapentin  (NEURONTIN ) 100 MG capsule Take 1 capsule (100 mg total) by mouth 3 (three) times daily. 03/04/22   Clark, Meghan R, PA-C  HYDROcodone -acetaminophen  (NORCO/VICODIN) 5-325 MG  tablet Take 1 tablet by mouth every 6 (six) hours as needed. 12/11/22   Maczis, Merrick Abe, PA-C  methylPREDNISolone  (MEDROL  DOSEPAK) 4 MG TBPK tablet Take as directed per package instructions 04/11/22   Elisa Guest, PA-C  Multiple Vitamin (MULTIVITAMIN) tablet Take 1 tablet by mouth daily.    [provider]  omeprazole  (PRILOSEC) 40 MG capsule Take 1 capsule 30 minutes prior to breakfast meal each day. 04/21/21   Daina Drum, MD  ondansetron  (ZOFRAN -ODT) 4 MG disintegrating tablet 4mg  ODT q4 hours prn nausea/vomit 12/11/22   Zammit, Joseph, MD  polyethylene glycol (MIRALAX ) 17 g packet Take 17 g by mouth daily as needed. 12/11/22   Maczis, Michael M, PA-C  predniSONE  (STERAPRED UNI-PAK 21 TAB) 10 MG (21) TBPK tablet Take by mouth daily. Take 6 tabs by mouth daily  for 2 days, then 5 tabs for 2 days, then 4 tabs for 2 days, then 3 tabs for 2 days, 2 tabs for 2 days, then 1 tab by mouth daily for 2 days 03/03/22   Mayer, Jodi R, NP  Past Surgical History Past Surgical History:  Procedure Laterality Date   HERNIA REPAIR     NO PAST SURGERIES     Family History Family History  Problem Relation Age of Onset   COPD Mother    Arthritis Mother    Hypertension Mother    Diabetes Brother     Social History Social History   Tobacco Use   Smoking status: Former    Current packs/day: 0.50    Types: E-cigarettes, Cigarettes   Smokeless tobacco: Never  Vaping Use   Vaping status: Every Day   Substances: Flavoring  Substance Use Topics   Alcohol use: No   Drug use: No   Allergies Lidocaine , Penicillins, and Procaine  Review of Systems Review of Systems  Cardiovascular:  Positive for chest pain.    Physical Exam Vital Signs  I have reviewed the triage vital signs BP 108/80   Pulse (!) 51   Temp 97.7 F (36.5 C) (Oral)   Resp 18   Ht 5'  7" (1.702 m)   Wt 71.7 kg   SpO2 100%   BMI 24.75 kg/m   Physical Exam Constitutional:      General: He is not in acute distress.    Appearance: Normal appearance.  HENT:     Head: Normocephalic and atraumatic.     Nose: No congestion or rhinorrhea.  Eyes:     General:        Right eye: No discharge.        Left eye: No discharge.     Extraocular Movements: Extraocular movements intact.     Pupils: Pupils are equal, round, and reactive to light.  Cardiovascular:     Rate and Rhythm: Normal rate and regular rhythm.     Heart sounds: No murmur heard. Pulmonary:     Effort: No respiratory distress.     Breath sounds: No wheezing or rales.  Abdominal:     General: There is no distension.     Tenderness: There is no abdominal tenderness.  Musculoskeletal:        General: Normal range of motion.     Cervical back: Normal range of motion.  Skin:    General: Skin is warm and dry.  Neurological:     General: No focal deficit present.     Mental Status: He is alert.     ED Results and Treatments Labs (all labs ordered are listed, but only abnormal results are displayed) Labs Reviewed  BASIC METABOLIC PANEL WITH GFR - Abnormal; Notable for the following components:      Result Value   Glucose, Bld 103 (*)    All other components within normal limits  CBC  TROPONIN I (HIGH SENSITIVITY)  TROPONIN I (HIGH SENSITIVITY)                                                                                                                          Radiology DG Chest 2 View Result Date:  07/30/2023 CLINICAL DATA:  Left-sided chest pain since last night. EXAM: CHEST - 2 VIEW COMPARISON:  Radiographs 12/23/2020 and 03/10/2020. FINDINGS: The heart size and mediastinal contours are normal. The lungs are clear. There is no pleural effusion or pneumothorax. No acute osseous findings are identified. IMPRESSION: No evidence of acute cardiopulmonary process. Electronically Signed   By: Elmon Hagedorn M.D.   On: 07/30/2023 16:01    Pertinent labs & imaging results that were available during my care of the patient were reviewed by me and considered in my medical decision making (see MDM for details).  Medications Ordered in ED Medications  alum & mag hydroxide-simeth (MAALOX/MYLANTA) 200-200-20 MG/5ML suspension 30 mL (30 mLs Oral Given 07/30/23 2030)  lactated ringers bolus 1,000 mL (0 mLs Intravenous Stopped 07/30/23 2131)  famotidine (PEPCID) IVPB 20 mg premix (0 mg Intravenous Stopped 07/30/23 2131)                                                                                                                                     Procedures Procedures  (including critical care time)  Medical Decision Making / ED Course   This patient presents to the ED for concern of chest pain, this involves an extensive number of treatment options, and is a complaint that carries with it a high risk of complications and morbidity.  The differential diagnosis includes ACS, Aortic Dissection, Pneumothorax, Pneumonia, Esophageal Rupture, PE, Tamponade/Pericardial Effusion, pericarditis, esophageal spasm, dysrhythmia, GERD, costochondritis.  MDM: Patient seen emerged part for evaluation of chest pain.  Physical exam is largely unremarkable with no abnormal lung sounds or cardiac sounds.  ECG without elevation depression, no evidence of ischemia.  Laboratory evaluation unremarkable including negative high-sensitivity troponin and delta troponin.  Chest x-ray unremarkable.  Patient given Mylanta, Pepcid and fluid resuscitation and on reevaluation his symptoms have resolved.  He does state that he had spaghetti with tomato sauce yesterday evening and I do suspect that patient likely is suffering from gastritis today.  Given his complaints of food getting stuck in the lower esophagus he may benefit from outpatient evaluation by gastroenterology and endoscopy.  I placed an outpatient referral.  His heart score  is low and a low suspicion for ACS.  Spoke with Blaise Bumps and I have very low suspicion for PE.  At this time he does not meet inpatient criteria for admission and will be discharged with outpatient follow-up.  Return precautions given which he and his wife voiced understanding.   Additional history obtained: -Additional history obtained from wife -External records from outside source obtained and reviewed including: Chart review including previous notes, labs, imaging, consultation notes   Lab Tests: -I ordered, reviewed, and interpreted labs.   The pertinent results include:   Labs Reviewed  BASIC METABOLIC PANEL WITH GFR - Abnormal; Notable for the following components:      Result Value   Glucose, Bld 103 (*)  All other components within normal limits  CBC  TROPONIN I (HIGH SENSITIVITY)  TROPONIN I (HIGH SENSITIVITY)      EKG   EKG Interpretation Date/Time:  Monday July 30 2023 14:52:38 EDT Ventricular Rate:  53 PR Interval:  190 QRS Duration:  88 QT Interval:  406 QTC Calculation: 380 R Axis:   72  Text Interpretation: Sinus bradycardia When compared with ECG of 11-Dec-2022 07:46, PREVIOUS ECG IS PRESENT Confirmed by Tijana Walder ([id number]) on 07/30/2023 7:49:18 PM         Imaging Studies ordered: I ordered imaging studies including chest x-ray I independently visualized and interpreted imaging. I agree with the radiologist interpretation   Medicines ordered and prescription drug management: Meds ordered this encounter  Medications   alum & mag hydroxide-simeth (MAALOX/MYLANTA) 200-200-20 MG/5ML suspension 30 mL   lactated ringers bolus 1,000 mL   famotidine (PEPCID) IVPB 20 mg premix   alum & mag hydroxide-simeth (MAALOX MAX) 400-400-40 MG/5ML suspension    Sig: Take 15 mLs by mouth every 6 (six) hours as needed for indigestion.    Dispense:  355 mL    Refill:  0   omeprazole  (PRILOSEC) 20 MG capsule    Sig: Take 1 capsule (20 mg total) by mouth daily.     Dispense:  30 capsule    Refill:  0    -I have reviewed the patients home medicines and have made adjustments as needed  Critical interventions none   Cardiac Monitoring: The patient was maintained on a cardiac monitor.  I personally viewed and interpreted the cardiac monitored which showed an underlying rhythm of: NSR  Social Determinants of Health:  Factors impacting patients care include: none   Reevaluation: After the interventions noted above, I reevaluated the patient and found that they have :improved  Co morbidities that complicate the patient evaluation History reviewed. No pertinent past medical history.    Dispostion: I considered admission for this patient, but at this time he does not meet inpatient criteria for admission and will be discharged with outpatient follow-up     Final Clinical Impression(s) / ED Diagnoses Final diagnoses:  Atypical chest pain  Atrophic gastritis without hemorrhage     @PCDICTATION @    Karlyn Overman, MD 07/31/23 0101

## 2023-07-30 NOTE — ED Triage Notes (Signed)
 Pt came in via POV d/t Lt sided CP since last night, took 3 antacids & it eased a small amount & after waking this morning & moving around he states it has returned & worsened. Also reports that he feels a pulsing sensation in the vein in his neck. Endorses radiation of pain into his Lt shoulder/arm & neck. A/Ox4, rates his pain 8/10 during triage.

## 2023-08-30 IMAGING — DX DG CHEST 2V
2 series · 2 of 2 positions shown · non-contrast
Comparison: Chest radiograph dated 03/10/2020.

CLINICAL DATA: Chest pain.

EXAM:
CHEST - 2 VIEW

[chest pa]
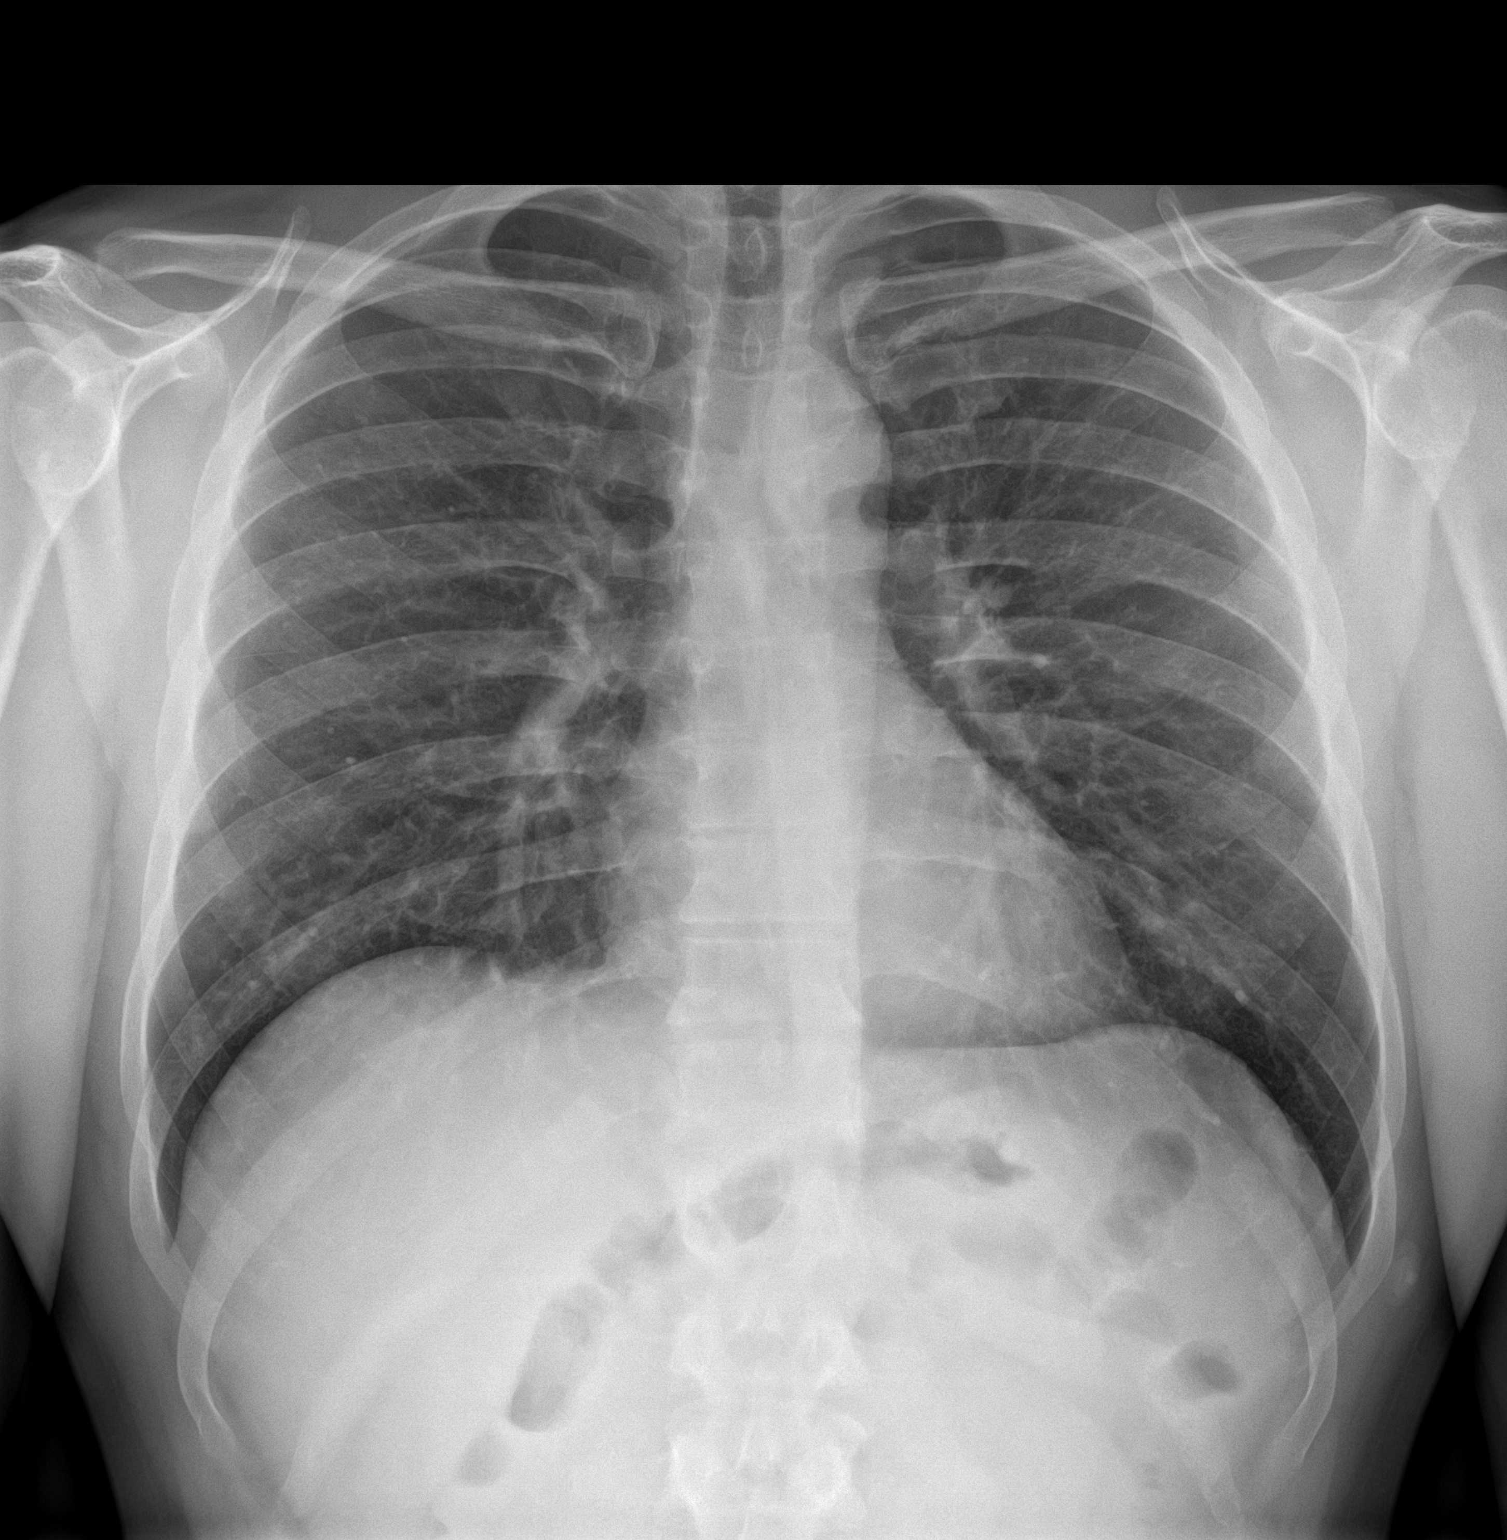

[chest lat]
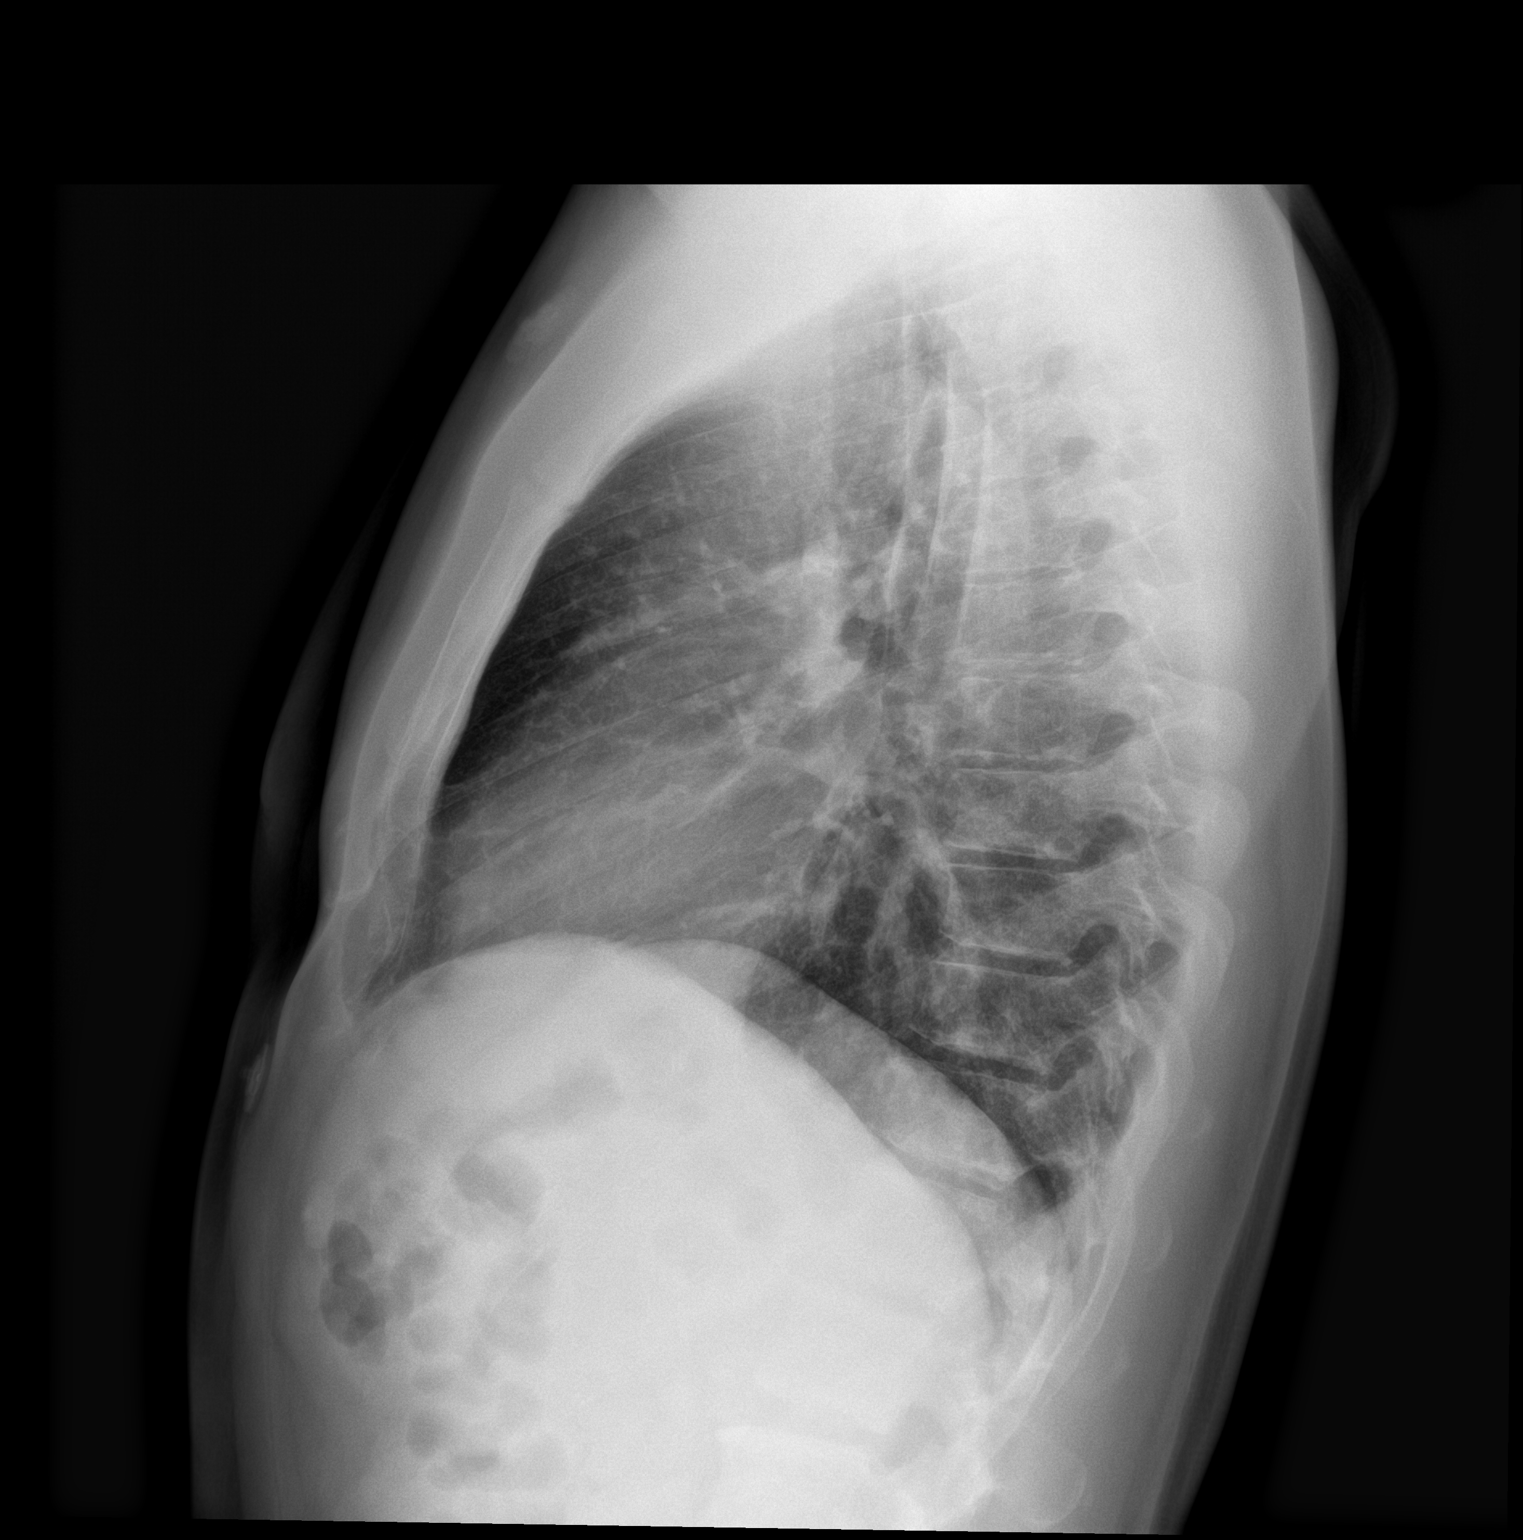

[2 of 2 positions shown; findings below may reference images not displayed]

FINDINGS: The heart size and mediastinal contours are within normal limits.
Both lungs are clear. The visualized skeletal structures are
unremarkable.
IMPRESSION: No active cardiopulmonary disease.

## 2023-09-21 ENCOUNTER — Encounter: Payer: Self-pay | Admitting: Gastroenterology

## 2023-09-21 ENCOUNTER — Ambulatory Visit: Admitting: Gastroenterology

## 2023-09-21 VITALS — BP 100/60 | HR 62 | Ht 67.0 in | Wt 148.0 lb

## 2023-09-21 DIAGNOSIS — R1319 Other dysphagia: Secondary | ICD-10-CM

## 2023-09-21 DIAGNOSIS — K219 Gastro-esophageal reflux disease without esophagitis: Secondary | ICD-10-CM

## 2023-09-21 DIAGNOSIS — R12 Heartburn: Secondary | ICD-10-CM

## 2023-09-21 DIAGNOSIS — R1013 Epigastric pain: Secondary | ICD-10-CM

## 2023-09-21 DIAGNOSIS — Z1211 Encounter for screening for malignant neoplasm of colon: Secondary | ICD-10-CM

## 2023-09-21 MED ORDER — OMEPRAZOLE 40 MG PO CPDR: 40.0000 mg | DELAYED_RELEASE_CAPSULE | Freq: Every day | ORAL | 3 refills | Status: DC

## 2023-09-21 NOTE — Patient Instructions (Signed)
 We have sent the following medications to your pharmacy for you to pick up at your convenience:  Omeprazole   You have been scheduled for an endoscopy. Please follow written instructions given to you at your visit today.  If you use inhalers (even only as needed), please bring them with you on the day of your procedure.  If you take any of the following medications, they will need to be adjusted prior to your procedure:   DO NOT TAKE 7 DAYS PRIOR TO TEST- Trulicity (dulaglutide) Ozempic, Wegovy (semaglutide) Mounjaro (tirzepatide) Bydureon Bcise (exanatide extended release)  DO NOT TAKE 1 DAY PRIOR TO YOUR TEST Rybelsus (semaglutide) Adlyxin (lixisenatide) Victoza (liraglutide) Byetta (exanatide) ___________________________________________________________________________  _______________________________________________________  If your blood pressure at your visit was 140/90 or greater, please contact your primary care physician to follow up on this.  _______________________________________________________  If you are age 49 or older, your body mass index should be between 23-30. Your Body mass index is 23.18 kg/m. If this is out of the aforementioned range listed, please consider follow up with your Primary Care Provider.  If you are age 20 or younger, your body mass index should be between 19-25. Your Body mass index is 23.18 kg/m. If this is out of the aformentioned range listed, please consider follow up with your Primary Care Provider.   ________________________________________________________  The [facility] providers would like to encourage you to use MYCHART to communicate with providers for non-urgent requests or questions.  Due to long hold times on the telephone, sending your provider a message by [facility] may be a faster and more efficient way to get a response.  Please allow 48 business hours for a response.  Please remember that this is for non-urgent requests.   _______________________________________________________  [facility] is using a team-based approach to care.  Your team is made up of your doctor and two to three APPS. Our APPS (Nurse Practitioners and Physician Assistants) work with your physician to ensure care continuity for you. They are fully qualified to address your health concerns and develop a treatment plan. They communicate directly with your gastroenterologist to care for you. Seeing the Advanced Practice Practitioners on your physician's team can help you by facilitating care more promptly, often allowing for earlier appointments, access to diagnostic testing, procedures, and other specialty referrals.

## 2023-09-21 NOTE — Progress Notes (Signed)
 Mark Bright [id number] Jul 11, 1980   Chief Complaint: GERD, chest pain  Referring Provider: Albertina Dixon, MD Primary GI MD: Dr. Federico  HPI: Mark Bright is a 43 y.o. male with past medical history of GERD, right inguinal hernia repair who presents today for ED follow-up.    He was last seen in our office by Dr. Federico 04/21/2021 for complaint of epigastric abdominal pain which had been worsening over time.  Also described intermittent dysphagia to solid foods.  RUQ US  was ordered to rule out gallstones and plan was for EGD for further evaluation of his symptoms.  Patient later called to cancel EGD due to a work conflict.  This was rescheduled, and also later canceled by patient.  07/30/2023 patient seen in the ED for complaint of chest pain which began the night prior and was worse in the epigastrium radiating into his left chest.  Endorsed burning sensation.  Stated he was previously on a PPI but had discontinued the medication as he had been doing well.  Also endorsed a sensation of food getting stuck in his lower esophagus.  Was able to tolerate secretions while in the ED.  Chest x-ray showed no acute cardiopulmonary process.  Physical exam largely unremarkable.  EKG without evidence of ischemia.  Laboratory evaluation unremarkable including negative high-sensitivity troponin and delta troponin. He was given Mylanta, Pepcid , and fluid resuscitation and on reevaluation his symptoms had resolved.  Gastritis suspected.  He was advised to follow-up with GI for endoscopic evaluation.   Patient states he has been doing fairly well.  Took omeprazole  40 mg for 2 weeks, then ran out of prescription and has been taking OTC Prilosec daily.  He has also made significant dietary changes including avoiding tomato-based products and acidic foods.  With dietary changes he has lost some weight.  Denies decreased appetite.  He continues to experience occasional breakthrough symptoms if he eats  certain foods.  Symptoms include burning epigastric pain, burning chest pain, burning in his throat.  Denies significant reflux or regurgitation.  He does endorse difficulty swallowing and states that it often feels like food goes down slowly or that there is some inflammation in his esophagus.  Can feel that food is stuck in his lower chest and this can be uncomfortable but eventually does pass.  He denies any melena.  States that he has normal bowel movements daily and denies constipation, diarrhea, blood in his stool.  Previous GI Procedures/Imaging   RUQ US  04/27/2021 Normal right upper quadrant ultrasound   Past Surgical History:  Procedure Laterality Date   HERNIA REPAIR     NO PAST SURGERIES      Current Outpatient Medications  Medication Sig Dispense Refill   alum & mag hydroxide-simeth (MAALOX MAX) 400-400-40 MG/5ML suspension Take 15 mLs by mouth every 6 (six) hours as needed for indigestion. 355 mL 0   cyclobenzaprine  (FLEXERIL ) 10 MG tablet Take 1 tablet (10 mg total) by mouth 2 (two) times daily as needed for muscle spasms. 20 tablet 0   gabapentin  (NEURONTIN ) 100 MG capsule Take 1 capsule (100 mg total) by mouth 3 (three) times daily. 30 capsule 0   HYDROcodone -acetaminophen  (NORCO/VICODIN) 5-325 MG tablet Take 1 tablet by mouth every 6 (six) hours as needed. 15 tablet 0   methylPREDNISolone  (MEDROL  DOSEPAK) 4 MG TBPK tablet Take as directed per package instructions 21 tablet 0   Multiple Vitamin (MULTIVITAMIN) tablet Take 1 tablet by mouth daily.     omeprazole  (PRILOSEC) 20 MG  capsule Take 1 capsule (20 mg total) by mouth daily. 30 capsule 0   omeprazole  (PRILOSEC) 40 MG capsule Take 1 capsule 30 minutes prior to breakfast meal each day. 90 capsule 3   ondansetron  (ZOFRAN -ODT) 4 MG disintegrating tablet 4mg  ODT q4 hours prn nausea/vomit 12 tablet 0   polyethylene glycol (MIRALAX ) 17 g packet Take 17 g by mouth daily as needed.     predniSONE  (STERAPRED UNI-PAK 21 TAB) 10  MG (21) TBPK tablet Take by mouth daily. Take 6 tabs by mouth daily  for 2 days, then 5 tabs for 2 days, then 4 tabs for 2 days, then 3 tabs for 2 days, 2 tabs for 2 days, then 1 tab by mouth daily for 2 days 42 tablet 0   No current facility-administered medications for this visit.    Allergies as of 09/21/2023 - Review Complete 07/30/2023  Allergen Reaction Noted   Lidocaine   12/11/2022   Penicillins Other (See Comments) 09/10/2011   Procaine Other (See Comments) 08/26/2020    Family History  Problem Relation Age of Onset   COPD Mother    Arthritis Mother    Hypertension Mother    Diabetes Brother     Social History   Tobacco Use   Smoking status: Former    Current packs/day: 0.50    Types: E-cigarettes, Cigarettes   Smokeless tobacco: Never  Vaping Use   Vaping status: Every Day   Substances: Flavoring  Substance Use Topics   Alcohol use: No   Drug use: No     Review of Systems:    Constitutional: No unexplained weight loss, fever, chills, weakness or fatigue Skin: No rash or itching Cardiovascular: No chest pain, chest pressure or palpitations   Respiratory: No SOB or cough Gastrointestinal: See HPI and otherwise negative Genitourinary: No dysuria or change in urinary frequency Neurological: No headache, dizziness or syncope Musculoskeletal: No new muscle or joint pain Hematologic: No bleeding or bruising    Physical Exam:  Vital signs: BP 100/60   Pulse 62   Ht 5' 7 (1.702 m)   Wt 148 lb (67.1 kg)   BMI 23.18 kg/m    Constitutional: Pleasant male in NAD, alert and cooperative Head:  Normocephalic and atraumatic.  Eyes: No scleral icterus. Conjunctiva pink. Mouth: No oral lesions.  Poor dentition. Respiratory: Respirations even and unlabored. Lungs clear to auscultation bilaterally.  No wheezes, crackles, or rhonchi.  Cardiovascular:  Regular rate and rhythm. No murmurs. No peripheral edema. Gastrointestinal:  Soft, nondistended, nontender. No  rebound or guarding. Normal bowel sounds. No appreciable masses or hepatomegaly. Rectal:  Not performed.  Neurologic:  Alert and oriented x4;  grossly normal neurologically.  Skin:   Dry and intact without significant lesions or rashes. Psychiatric: Oriented to person, place and time. Demonstrates good judgement and reason without abnormal affect or behaviors.   RELEVANT LABS AND IMAGING: CBC    Component Value Date/Time   WBC 9.0 07/30/2023 1450   RBC 4.79 07/30/2023 1450   HGB 15.3 07/30/2023 1450   HGB 16.1 09/06/2020 1450   HCT 44.7 07/30/2023 1450   HCT 47.3 09/06/2020 1450   PLT 284 07/30/2023 1450   PLT 270 09/06/2020 1450   MCV 93.3 07/30/2023 1450   MCV 90 09/06/2020 1450   MCH 31.9 07/30/2023 1450   MCHC 34.2 07/30/2023 1450   RDW 11.9 07/30/2023 1450   RDW 11.9 09/06/2020 1450   LYMPHSABS 1.5 09/06/2020 1450   EOSABS 0.3 09/06/2020 1450   BASOSABS  0.1 09/06/2020 1450    CMP     Component Value Date/Time   NA 140 07/30/2023 1450   NA 141 09/06/2020 1450   K 3.8 07/30/2023 1450   CL 105 07/30/2023 1450   CO2 25 07/30/2023 1450   GLUCOSE 103 (H) 07/30/2023 1450   BUN 9 07/30/2023 1450   BUN 12 09/06/2020 1450   CREATININE 0.99 07/30/2023 1450   CALCIUM 9.9 07/30/2023 1450   PROT 7.3 09/06/2020 1450   ALBUMIN 5.1 (H) 09/06/2020 1450   AST 23 09/06/2020 1450   ALT 21 09/06/2020 1450   ALKPHOS 87 09/06/2020 1450   BILITOT 0.8 09/06/2020 1450   GFRNONAA >60 07/30/2023 1450   GFRAA >60 07/17/2019 1926     Assessment/Plan:   GERD Epigastric pain Heartburn Dysphagia Patient with history of heartburn/acid reflux, and dysphagia.  Previously evaluated by Dr. Federico in 2023 and had planned for EGD at that time.  Patient had to cancel procedure due to work/family conflicts and was lost to follow-up.    Recently presented to the ED with burning chest and epigastric pain.  Chest x-ray showed no acute cardiopulmonary process.  EKG without evidence of ischemia.   Laboratory evaluation unremarkable including negative high-sensitivity troponin and delta troponin. Advised to follow-up with GI.  Symptoms have improved with omeprazole  and lifestyle modifications, though he continues to have dysphagia, and occasional breakthrough heartburn.  - Schedule EGD with possible dilation. I thoroughly discussed the procedure with the patient to include nature of the procedure, alternatives, benefits, and risks (including but not limited to bleeding, infection, perforation, anesthesia/cardiac/pulmonary complications). Patient verbalized understanding and gave verbal consent to proceed with procedure.  - Start omeprazole  40 mg daily - Lifestyle modifications for GERD  Screening for colon cancer Patient will be due for for screening colonoscopy this year.  Can discuss this at follow-up.  In the event follow-up EGD is needed, could potentially do these procedures together.   Camie Furbish, PA-C [facility] 09/21/2023, 8:12 AM  Patient Care Team: Pcp, No as PCP - General Belinda Cough, MD as Consulting Physician (General Surgery)

## 2023-09-24 NOTE — Progress Notes (Signed)
 I agree with the assessment and plan as outlined by Ms. Heinz.

## 2023-10-03 ENCOUNTER — Encounter: Payer: Self-pay | Admitting: Internal Medicine

## 2023-10-11 ENCOUNTER — Ambulatory Visit (AMBULATORY_SURGERY_CENTER): Admitting: Internal Medicine

## 2023-10-11 ENCOUNTER — Encounter: Payer: Self-pay | Admitting: Internal Medicine

## 2023-10-11 VITALS — BP 98/54 | HR 50 | Temp 98.8°F | Resp 18 | Ht 67.0 in | Wt 148.0 lb

## 2023-10-11 DIAGNOSIS — K319 Disease of stomach and duodenum, unspecified: Secondary | ICD-10-CM

## 2023-10-11 DIAGNOSIS — B3781 Candidal esophagitis: Secondary | ICD-10-CM | POA: Diagnosis not present

## 2023-10-11 DIAGNOSIS — K219 Gastro-esophageal reflux disease without esophagitis: Secondary | ICD-10-CM

## 2023-10-11 DIAGNOSIS — R1319 Other dysphagia: Secondary | ICD-10-CM

## 2023-10-11 DIAGNOSIS — R1013 Epigastric pain: Secondary | ICD-10-CM

## 2023-10-11 MED ORDER — SODIUM CHLORIDE 0.9 % IV SOLN: 500.0000 mL | Freq: Once | INTRAVENOUS | Status: DC

## 2023-10-11 NOTE — Progress Notes (Signed)
 Pt's states no medical or surgical changes since previsit or office visit.

## 2023-10-11 NOTE — Progress Notes (Signed)
 Called to room to assist during endoscopic procedure.  Patient ID and intended procedure confirmed with present staff. Received instructions for my participation in the procedure from the performing physician.

## 2023-10-11 NOTE — Patient Instructions (Signed)
 Increase your omeprazole  to twice daily for 8 weeks the  daily.  Be sure to take it on an empty stomach. Resume your previous medications today as ordered.  Abide by your special diet for today. Read your discharge instructions.   YOU HAD AN ENDOSCOPIC PROCEDURE TODAY AT THE [facility]:   Refer to the procedure report that was given to you for any specific questions about what was found during the examination.  If the procedure report does not answer your questions, please call your gastroenterologist to clarify.  If you requested that your care partner not be given the details of your procedure findings, then the procedure report has been included in a sealed envelope for you to review at your convenience later.  YOU SHOULD EXPECT: Some feelings of bloating in the abdomen. Passage of more gas than usual.  Walking can help get rid of the air that was put into your GI tract during the procedure and reduce the bloating.   Please Note:  You might notice some irritation and congestion in your nose or some drainage.  This is from the oxygen used during your procedure.  There is no need for concern and it should clear up in a day or so.  SYMPTOMS TO REPORT IMMEDIATELY:  Following upper endoscopy (EGD)  Vomiting of blood or coffee ground material  New chest pain or pain under the shoulder blades  Painful or persistently difficult swallowing  New shortness of breath  Fever of 100F or higher  Black, tarry-looking stools  For urgent or emergent issues, a gastroenterologist can be reached at any hour by calling [phone]. Do not use MyChart messaging for urgent concerns.    DIET:  We do recommend clear liquids until 5 pm.  Then a soft diet for the rest of today. Then you may proceed to your regular diet tomorrow.  Drink plenty of fluids but you should avoid alcoholic beverages for 24 hours.  ACTIVITY:  You should plan to take it easy for the rest of today and you should NOT DRIVE  or use heavy machinery until tomorrow (because of the sedation medicines used during the test).    FOLLOW UP: Our staff will call the number listed on your records the next business day following your procedure.  We will call around 7:15- 8:00 am to check on you and address any questions or concerns that you may have regarding the information given to you following your procedure. If we do not reach you, we will leave a message.     If any biopsies were taken you will be contacted by phone or by letter within the next 1-3 weeks.  Please call us  at [phone] if you have not heard about the biopsies in 3 weeks.    SIGNATURES/CONFIDENTIALITY: You and/or your care partner have signed paperwork which will be entered into your electronic medical record.  These signatures attest to the fact that that the information above on your After Visit Summary has been reviewed and is understood.  Full responsibility of the confidentiality of this discharge information lies with you and/or your care-partner.

## 2023-10-11 NOTE — Progress Notes (Signed)
 GASTROENTEROLOGY PROCEDURE H&P NOTE   Primary Care Physician: Pcp, No    Reason for Procedure:   GERD, epigastric ab pain, dysphagia  Plan:    EGD  Patient is appropriate for endoscopic procedure(s) in the ambulatory ([facility]) setting.  The nature of the procedure, as well as the risks, benefits, and alternatives were carefully and thoroughly reviewed with the patient. Ample time for discussion and questions allowed. The patient understood, was satisfied, and agreed to proceed.     HPI: Mark Bright is a 43 y.o. male who presents for EGD for evaluation of GERD, epigastric ab pain, dysphagia.  Patient was most recently seen in the Gastroenterology Clinic on 09/21/23.  No interval change in medical history since that appointment. Please refer to that note for full details regarding GI history and clinical presentation.   Past Medical History:  Diagnosis Date   GERD (gastroesophageal reflux disease)     Past Surgical History:  Procedure Laterality Date   INGUINAL HERNIA REPAIR Right    [facility]    Prior to Admission medications   Medication Sig Start Date End Date Taking? Authorizing Provider  alum & mag hydroxide-simeth (MAALOX MAX) 400-400-40 MG/5ML suspension Take 15 mLs by mouth every 6 (six) hours as needed for indigestion. 07/30/23   Kommor, Madison, MD  omeprazole  (PRILOSEC) 20 MG capsule Take 1 capsule (20 mg total) by mouth daily. 07/30/23 09/21/23  Kommor, Madison, MD  omeprazole  (PRILOSEC) 40 MG capsule Take 1 capsule (40 mg total) by mouth daily. 09/21/23   Heinz, Sara E, PA-C    Current Outpatient Medications  Medication Sig Dispense Refill   alum & mag hydroxide-simeth (MAALOX MAX) 400-400-40 MG/5ML suspension Take 15 mLs by mouth every 6 (six) hours as needed for indigestion. 355 mL 0   omeprazole  (PRILOSEC) 20 MG capsule Take 1 capsule (20 mg total) by mouth daily. 30 capsule 0   omeprazole  (PRILOSEC) 40 MG capsule Take 1 capsule (40 mg total) by mouth daily.  30 capsule 3   Current Facility-Administered Medications  Medication Dose Route Frequency Provider Last Rate Last Admin   0.9 %  sodium chloride  infusion  500 mL Intravenous Once Reagyn Facemire C, MD        Allergies as of 10/11/2023 - Review Complete 10/11/2023  Allergen Reaction Noted   Lidocaine  Other (See Comments) 12/11/2022   Penicillins Other (See Comments) 09/10/2011   Procaine Other (See Comments) 08/26/2020    Family History  Problem Relation Age of Onset   COPD Mother    Arthritis Mother    Hypertension Mother    Other Father        had a bad fall at work and was parailzed   Diabetes Brother    Colon cancer Neg Hx    Esophageal cancer Neg Hx     Social History   Socioeconomic History   Marital status: Married    Spouse name: Brook   Number of children: 2   Years of education: HS Diploma + trade classes   Highest education level: Not on file  Occupational History   Occupation: Tax adviser  Tobacco Use   Smoking status: Former    Current packs/day: 0.50    Types: E-cigarettes, Cigarettes   Smokeless tobacco: Never  Vaping Use   Vaping status: Every Day   Substances: Flavoring  Substance and Sexual Activity   Alcohol use: No   Drug use: No   Sexual activity: Yes    Partners: Female  Birth control/protection: None  Other Topics Concern   Not on file  Social History Narrative   Lives with his wife and their 2 daughters.   Social Drivers of Corporate investment banker Strain: Not on file  Food Insecurity: Not on file  Transportation Needs: Not on file  Physical Activity: Not on file  Stress: Not on file  Social Connections: Not on file  Intimate Partner Violence: Not on file    Physical Exam: Vital signs in last 24 hours: BP 116/63   Pulse (!) 58   Temp 98.8 F (37.1 C)   Ht 5' 7 (1.702 m)   Wt 148 lb (67.1 kg)   SpO2 100%   BMI 23.18 kg/m  GEN: NAD EYE: Sclerae anicteric ENT: MMM CV: Non-tachycardic Pulm: No increased  WOB GI: Soft NEURO:  Alert & Oriented   Estefana Kidney, MD [facility]   10/11/2023 2:50 PM

## 2023-10-11 NOTE — Progress Notes (Signed)
 Vss nad trans to pacu

## 2023-10-11 NOTE — Progress Notes (Signed)
 Patient has enough refills on omeprazole .

## 2023-10-11 NOTE — Op Note (Signed)
 [facility] Patient Name: Mark Bright Procedure Date: 10/11/2023 3:14 PM MRN: [id number] Endoscopist: Rosario Estefana Kidney , , [id number] Age: 43 Referring MD:  Date of Birth: 10/23/1980 Gender: Male Account #: [id number] Procedure:                Upper GI endoscopy Indications:              Epigastric abdominal pain, Dysphagia, Heartburn Medicines:                Monitored Anesthesia Care Procedure:                Pre-Anesthesia Assessment:                           - Prior to the procedure, a History and Physical                            was performed, and patient medications and                            allergies were reviewed. The patient's tolerance of                            previous anesthesia was also reviewed. The risks                            and benefits of the procedure and the sedation                            options and risks were discussed with the patient.                            All questions were answered, and informed consent                            was obtained. Prior Anticoagulants: The patient has                            taken no anticoagulant or antiplatelet agents. ASA                            Grade Assessment: II - A patient with mild systemic                            disease. After reviewing the risks and benefits,                            the patient was deemed in satisfactory condition to                            undergo the procedure.                           After obtaining informed consent, the endoscope was  passed under direct vision. Throughout the                            procedure, the patient's blood pressure, pulse, and                            oxygen saturations were monitored continuously. The                            Olympus Scope [id number] was introduced through the                            mouth, and advanced to the second part of duodenum.                             The upper GI endoscopy was accomplished without                            difficulty. The patient tolerated the procedure                            well. Scope In: Scope Out: 3:37:50 PM Findings:                 White nummular lesions were noted in the entire                            esophagus. A guidewire was placed and the scope was                            withdrawn. Dilation was performed with a Savary                            dilator with mild resistance at 18 mm. Biopsies                            were taken with a cold forceps for histology.                           Localized mildly erythematous mucosa without                            bleeding was found in the gastric antrum. Biopsies                            were taken with a cold forceps for histology.                           The examined duodenum was normal. Complications:            No immediate complications. Estimated Blood Loss:     Estimated blood loss was minimal. Impression:               - White nummular lesions in esophageal mucosa.  Dilated. Biopsied.                           - Erythematous mucosa in the antrum. Biopsied.                           - Normal examined duodenum. Recommendation:           - Discharge patient to home (with escort).                           - Await pathology results.                           - Increase omeprazole  to 40 mg BID for 8 weeks,                            then decrease to QD.                           - Return to GI clinic in 2-3 months.                           - The findings and recommendations were discussed                            with the patient. Dr Estefana Federico Rosario Estefana Federico,  10/11/2023 3:41:24 PM

## 2023-10-12 ENCOUNTER — Telehealth: Payer: Self-pay | Admitting: *Deleted

## 2023-10-12 NOTE — Telephone Encounter (Signed)
  Follow up Call-     10/11/2023    2:40 PM  Call back number  Post procedure Call Back phone  # [phone]  Permission to leave phone message Yes     Patient questions:  Do you have a fever, pain , or abdominal swelling? No. Pain Score  0 *  Have you tolerated food without any problems? Yes.    Have you been able to return to your normal activities? Yes.    Do you have any questions about your discharge instructions: Diet   No. Medications  No. Follow up visit  No.  Do you have questions or concerns about your Care? No.  Actions: * If pain score is 4 or above: No action needed, pain <4.

## 2023-10-16 LAB — SURGICAL PATHOLOGY

## 2023-10-17 ENCOUNTER — Other Ambulatory Visit: Payer: Self-pay

## 2023-10-17 ENCOUNTER — Ambulatory Visit: Payer: Self-pay | Admitting: Internal Medicine

## 2023-10-17 DIAGNOSIS — B3781 Candidal esophagitis: Secondary | ICD-10-CM

## 2023-10-17 MED ORDER — FLUCONAZOLE 200 MG PO TABS: ORAL_TABLET | ORAL | 0 refills | Status: AC

## 2023-10-17 MED ORDER — FLUCONAZOLE 200 MG PO TABS: ORAL_TABLET | ORAL | 0 refills | Status: DC

## 2023-10-17 NOTE — Progress Notes (Signed)
 Pod B triage, please let the patient know that his esophageal biopsies came back with a yeast infection called candida. He will need to take fluconazole  400 mg x 1 dose, followed by 200 mg daily x 13 doses. Thank you

## 2023-11-16 ENCOUNTER — Ambulatory Visit: Admitting: Physician Assistant

## 2023-11-16 NOTE — Progress Notes (Deleted)
 11/16/2023 DELQUAN POUCHER [id number] 1980-10-11  Referring provider: No ref. provider found Primary GI doctor: Dr. Federico  ASSESSMENT AND PLAN:  GERD with dyspepsia and dysphagia and history of esophageal candidiasis 04/27/2021 RUQ US  unremarkable 10/11/2023 EGD white nummular lesions esophageal mucosa, erythematous mucosa in the antrum normal examined duodenum.  Path negative H. pylori nonspecific reactive gastropathy, candidal esophagitis.  Treated with fluconazole  for 100 mg 1 dose and 200 mg daily for 13 doses - will get following labs HIV, DM, complements - will get barium swallow, consider manometry  Screening colonoscopy  Patient Care Team: Pcp, No as PCP - General Belinda Cough, MD as Consulting Physician (General Surgery)  HISTORY OF PRESENT ILLNESS: 43 y.o. male with a past medical history listed below presents for evaluation of ***.   Last seen in the office 09/21/2023 by Lauraine Hug, PA.   *** Discussed the use of AI scribe software for clinical note transcription with the patient, who gave verbal consent to proceed.  History of Present Illness            He  reports that he has quit smoking. His smoking use included e-cigarettes and cigarettes. He has never used smokeless tobacco. He reports that he does not drink alcohol and does not use drugs.  RELEVANT GI HISTORY, IMAGING AND LABS: Results          CBC    Component Value Date/Time   WBC 9.0 07/30/2023 1450   RBC 4.79 07/30/2023 1450   HGB 15.3 07/30/2023 1450   HGB 16.1 09/06/2020 1450   HCT 44.7 07/30/2023 1450   HCT 47.3 09/06/2020 1450   PLT 284 07/30/2023 1450   PLT 270 09/06/2020 1450   MCV 93.3 07/30/2023 1450   MCV 90 09/06/2020 1450   MCH 31.9 07/30/2023 1450   MCHC 34.2 07/30/2023 1450   RDW 11.9 07/30/2023 1450   RDW 11.9 09/06/2020 1450   LYMPHSABS 1.5 09/06/2020 1450   EOSABS 0.3 09/06/2020 1450   BASOSABS 0.1 09/06/2020 1450   Recent Labs    12/11/22 0833 07/30/23 1450   HGB 17.4* 15.3    CMP     Component Value Date/Time   NA 140 07/30/2023 1450   NA 141 09/06/2020 1450   K 3.8 07/30/2023 1450   CL 105 07/30/2023 1450   CO2 25 07/30/2023 1450   GLUCOSE 103 (H) 07/30/2023 1450   BUN 9 07/30/2023 1450   BUN 12 09/06/2020 1450   CREATININE 0.99 07/30/2023 1450   CALCIUM 9.9 07/30/2023 1450   PROT 7.3 09/06/2020 1450   ALBUMIN 5.1 (H) 09/06/2020 1450   AST 23 09/06/2020 1450   ALT 21 09/06/2020 1450   ALKPHOS 87 09/06/2020 1450   BILITOT 0.8 09/06/2020 1450   GFRNONAA >60 07/30/2023 1450   GFRAA >60 07/17/2019 1926      Latest Ref Rng & Units 09/06/2020    2:50 PM 06/19/2017    3:07 PM 09/27/2015    9:28 PM  Hepatic Function  Total Protein 6.0 - 8.5 g/dL 7.3  6.6  6.2   Albumin 4.0 - 5.0 g/dL 5.1  4.4  4.2   AST 0 - 40 IU/L 23  15  19    ALT 0 - 44 IU/L 21  13  15    Alk Phosphatase 44 - 121 IU/L 87  64  60   Total Bilirubin 0.0 - 1.2 mg/dL 0.8  0.7  0.9       Current Medications:  Current Outpatient Medications (Analgesics):    ibuprofen  (ADVIL ) 200 MG tablet, Take 200 mg by mouth.   Current Outpatient Medications (Other):    alum & mag hydroxide-simeth (MAALOX MAX) 400-400-40 MG/5ML suspension, Take 15 mLs by mouth every 6 (six) hours as needed for indigestion.   fluconazole  (DIFLUCAN ) 200 MG tablet, Take 400 mg for 1 dose  Take 200 mg daily for 13 doses.   omeprazole  (PRILOSEC) 20 MG capsule, Take 1 capsule (20 mg total) by mouth daily.   omeprazole  (PRILOSEC) 40 MG capsule, Take 1 capsule (40 mg total) by mouth daily.  Medical History:  Past Medical History:  Diagnosis Date   GERD (gastroesophageal reflux disease)    Allergies:  Allergies  Allergen Reactions   Lidocaine  Other (See Comments)    Makes my body tingle and makes me feel like I'm going to pass out   Penicillins Other (See Comments)    Describes black out episode after PCN injection 15 yrs ago   Procaine Other (See Comments)    And lidocaine   Feels  like my head's tingling  And lidocaine  Feels like my head's tingling     Surgical History:  He  has a past surgical history that includes Inguinal hernia repair (Right). Family History:  His family history includes Arthritis in his mother; COPD in his mother; Diabetes in his brother; Hypertension in his mother; Other in his father.  REVIEW OF SYSTEMS  : All other systems reviewed and negative except where noted in the History of Present Illness.  PHYSICAL EXAM: There were no vitals taken for this visit. Physical Exam          Alan JONELLE Coombs, PA-C 7:54 AM

## 2023-12-11 ENCOUNTER — Ambulatory Visit: Admitting: Gastroenterology

## 2023-12-25 NOTE — Progress Notes (Unsigned)
 12/26/2023 Mark Bright [id number] 1980-05-03  Referring provider: No ref. provider found Primary GI doctor: Dr. Federico  ASSESSMENT AND PLAN:  GERD/dysphagia 04/27/2021 RUQ US  normal 10/11/2023 EGD white nummular lesions esophageal mucosa dilated biopsied erythematous mucosa in the antrum biopsied normal duodenum Path negative H. pylori mild nonspecific reactive gastropathy, candidal esophagitis treated with fluconazole  He is not on inhalers, no recent ABX, poor dentition Rare NSAIDS, rare ETOH Continuing trouble swallowing with chicken/sticky foods, cold water can feel different but no issues with liquids On omeprazole  40 mg with improvement of symptoms Has 80 % improvement of symptoms - discussed barium swallow but prefers to wait at this time, follow up 6 months, will call if symptoms are worse or not better and/or repeat EGD -In the interim patient advised about swallowing precautions.  -Eat slowly, chew food well before swallowing.  -Drink liquids in between each bite to avoid food impaction. - ER precautions discussed with the patient - HIV testing  Screening colonoscopy No family history of colon cancer No changes in BM, no hematochezia Discussed cologuard versus colonoscopy, he prefers colonoscopy but will call back with his schedule okay for previsit/direct admit  Patient Care Team: Pcp, No as PCP - General Belinda Cough, MD as Consulting Physician (General Surgery)  HISTORY OF PRESENT ILLNESS: 43 y.o. male with a past medical history listed below presents for evaluation of dysphagia.   Patient last seen in the office 09/21/2023 by Lauraine Furbish, PA for epigastric abdominal pain. Set up for EGD  Discussed the use of AI scribe software for clinical note transcription with the patient, who gave verbal consent to proceed.  History of Present Illness   Mark Bright is a 43 year old male who presents with difficulty swallowing and esophageal yeast  infection.  He experiences difficulty swallowing, particularly with foods that are 'sticky' or have heavy sauces, such as shredded chicken. Liquids do not cause issues, although cold water sometimes feels 'weird'. He occasionally experiences heartburn, especially with plain water, but reflux symptoms have improved significantly since starting treatment.  He reports that an endoscopy showed yeast in his esophagus and some inflammation in his stomach. He was treated for the yeast infection and reports that his symptoms have improved significantly, estimating about eighty percent improvement. He continues to take omeprazole  40 mg every morning before breakfast, which initially caused nausea when taken twice daily.  He denies recent use of antibiotics or steroids, with the last use of prednisone  being a couple of years ago. He does not smoke, rarely consumes alcohol, and occasionally takes ibuprofen  for muscle pain. He has no history of IV drug use and reports he does not get sick often. No allergies.   He experienced significant weight loss following hernia surgery last year. His bowel habits are regular with no blood in the stool, and there is no family history of colon cancer. He has two daughters, aged 45 and 7, and is expecting his second granddaughter next month. He mentions adjusting his diet to avoid foods that exacerbate his symptoms, such as pizza, red sauces, and garlic.      He  reports that he has quit smoking. His smoking use included e-cigarettes and cigarettes. He has never used smokeless tobacco. He reports that he does not drink alcohol and does not use drugs.  RELEVANT GI HISTORY, IMAGING AND LABS: Results   DIAGNOSTIC Endoscopy: Esophageal candidiasis, gastric inflammation, negative for infection (08/2023)     RUQ US  04/27/2021 Normal right upper  quadrant ultrasound CBC    Component Value Date/Time   WBC 9.0 07/30/2023 1450   RBC 4.79 07/30/2023 1450   HGB 15.3 07/30/2023 1450    HGB 16.1 09/06/2020 1450   HCT 44.7 07/30/2023 1450   HCT 47.3 09/06/2020 1450   PLT 284 07/30/2023 1450   PLT 270 09/06/2020 1450   MCV 93.3 07/30/2023 1450   MCV 90 09/06/2020 1450   MCH 31.9 07/30/2023 1450   MCHC 34.2 07/30/2023 1450   RDW 11.9 07/30/2023 1450   RDW 11.9 09/06/2020 1450   LYMPHSABS 1.5 09/06/2020 1450   EOSABS 0.3 09/06/2020 1450   BASOSABS 0.1 09/06/2020 1450   Recent Labs    07/30/23 1450  HGB 15.3    CMP     Component Value Date/Time   NA 140 07/30/2023 1450   NA 141 09/06/2020 1450   K 3.8 07/30/2023 1450   CL 105 07/30/2023 1450   CO2 25 07/30/2023 1450   GLUCOSE 103 (H) 07/30/2023 1450   BUN 9 07/30/2023 1450   BUN 12 09/06/2020 1450   CREATININE 0.99 07/30/2023 1450   CALCIUM 9.9 07/30/2023 1450   PROT 7.3 09/06/2020 1450   ALBUMIN 5.1 (H) 09/06/2020 1450   AST 23 09/06/2020 1450   ALT 21 09/06/2020 1450   ALKPHOS 87 09/06/2020 1450   BILITOT 0.8 09/06/2020 1450   GFRNONAA >60 07/30/2023 1450   GFRAA >60 07/17/2019 1926      Latest Ref Rng & Units 09/06/2020    2:50 PM 06/19/2017    3:07 PM 09/27/2015    9:28 PM  Hepatic Function  Total Protein 6.0 - 8.5 g/dL 7.3  6.6  6.2   Albumin 4.0 - 5.0 g/dL 5.1  4.4  4.2   AST 0 - 40 IU/L 23  15  19    ALT 0 - 44 IU/L 21  13  15    Alk Phosphatase 44 - 121 IU/L 87  64  60   Total Bilirubin 0.0 - 1.2 mg/dL 0.8  0.7  0.9       Current Medications:        Current Outpatient Medications (Other):    alum & mag hydroxide-simeth (MAALOX MAX) 400-400-40 MG/5ML suspension, Take 15 mLs by mouth every 6 (six) hours as needed for indigestion.   omeprazole  (PRILOSEC) 40 MG capsule, Take 1 capsule (40 mg total) by mouth daily.  Medical History:  Past Medical History:  Diagnosis Date   GERD (gastroesophageal reflux disease)    Allergies:  Allergies  Allergen Reactions   Lidocaine  Other (See Comments)    Makes my body tingle and makes me feel like I'm going to pass out   Penicillins  Other (See Comments)    Describes black out episode after PCN injection 15 yrs ago   Procaine Other (See Comments)    And lidocaine   Feels like my head's tingling  And lidocaine  Feels like my head's tingling     Surgical History:  He  has a past surgical history that includes Inguinal hernia repair (Right). Family History:  His family history includes Arthritis in his mother; COPD in his mother; Diabetes in his brother; Hypertension in his mother; Other in his father.  REVIEW OF SYSTEMS  : All other systems reviewed and negative except where noted in the History of Present Illness.  PHYSICAL EXAM: BP 104/70   Pulse 63   Ht 5' 7 (1.702 m)   Wt 158 lb (71.7 kg)   BMI 24.75 kg/m  Physical Exam   GENERAL APPEARANCE: Well nourished, in no apparent distress. HEENT: No cervical lymphadenopathy, unremarkable thyroid, sclerae anicteric, conjunctiva pink, no yeast or redness in throat. RESPIRATORY: Respiratory effort normal, breath sounds equal bilaterally without rales, rhonchi, or wheezing, lungs clear to auscultation bilaterally. CARDIO: Regular rate and rhythm with no murmurs, rubs, or gallops, peripheral pulses intact. ABDOMEN: Soft, non-distended, active bowel sounds in all four quadrants, no tenderness to palpation, no rebound, no mass appreciated. RECTAL: Declines. MUSCULOSKELETAL: Full range of motion, normal gait, without edema. SKIN: Dry, intact without rashes or lesions. No jaundice. NEURO: Alert, oriented, no focal deficits. PSYCH: Cooperative, normal mood and affect.      Alan JONELLE Coombs, PA-C 9:20 AM

## 2023-12-26 ENCOUNTER — Other Ambulatory Visit

## 2023-12-26 ENCOUNTER — Encounter: Payer: Self-pay | Admitting: Physician Assistant

## 2023-12-26 ENCOUNTER — Ambulatory Visit: Admitting: Physician Assistant

## 2023-12-26 VITALS — BP 104/70 | HR 63 | Ht 67.0 in | Wt 158.0 lb

## 2023-12-26 DIAGNOSIS — K219 Gastro-esophageal reflux disease without esophagitis: Secondary | ICD-10-CM

## 2023-12-26 DIAGNOSIS — B3781 Candidal esophagitis: Secondary | ICD-10-CM

## 2023-12-26 DIAGNOSIS — K089 Disorder of teeth and supporting structures, unspecified: Secondary | ICD-10-CM

## 2023-12-26 DIAGNOSIS — R1319 Other dysphagia: Secondary | ICD-10-CM

## 2023-12-26 DIAGNOSIS — Z1211 Encounter for screening for malignant neoplasm of colon: Secondary | ICD-10-CM

## 2023-12-26 DIAGNOSIS — Z114 Encounter for screening for human immunodeficiency virus [HIV]: Secondary | ICD-10-CM

## 2023-12-26 DIAGNOSIS — R1013 Epigastric pain: Secondary | ICD-10-CM

## 2023-12-26 LAB — CBC WITH DIFFERENTIAL/PLATELET
Basophils Absolute: 0 K/uL (ref 0.0–0.1)
Basophils Relative: 0.5 % (ref 0.0–3.0)
Eosinophils Absolute: 0.3 K/uL (ref 0.0–0.7)
Eosinophils Relative: 3.5 % (ref 0.0–5.0)
HCT: 45.3 % (ref 39.0–52.0)
Hemoglobin: 15.4 g/dL (ref 13.0–17.0)
Lymphocytes Relative: 11.8 % — ABNORMAL LOW (ref 12.0–46.0)
Lymphs Abs: 1 K/uL (ref 0.7–4.0)
MCHC: 34 g/dL (ref 30.0–36.0)
MCV: 92.3 fl (ref 78.0–100.0)
Monocytes Absolute: 0.4 K/uL (ref 0.1–1.0)
Monocytes Relative: 5.5 % (ref 3.0–12.0)
Neutro Abs: 6.4 K/uL (ref 1.4–7.7)
Neutrophils Relative %: 78.7 % — ABNORMAL HIGH (ref 43.0–77.0)
Platelets: 259 K/uL (ref 150.0–400.0)
RBC: 4.9 Mil/uL (ref 4.22–5.81)
RDW: 12.9 % (ref 11.5–15.5)
WBC: 8.1 K/uL (ref 4.0–10.5)

## 2023-12-26 LAB — COMPREHENSIVE METABOLIC PANEL WITH GFR
ALT: 17 U/L (ref 0–53)
AST: 17 U/L (ref 0–37)
Albumin: 4.7 g/dL (ref 3.5–5.2)
Alkaline Phosphatase: 56 U/L (ref 39–117)
BUN: 10 mg/dL (ref 6–23)
CO2: 27 meq/L (ref 19–32)
Calcium: 9.4 mg/dL (ref 8.4–10.5)
Chloride: 105 meq/L (ref 96–112)
Creatinine, Ser: 1.02 mg/dL (ref 0.40–1.50)
GFR: 88.97 mL/min (ref >60.00)
Glucose, Bld: 115 mg/dL — ABNORMAL HIGH (ref 70–99)
Potassium: 4.1 meq/L (ref 3.5–5.1)
Sodium: 138 meq/L (ref 135–145)
Total Bilirubin: 1.1 mg/dL (ref 0.2–1.2)
Total Protein: 7.1 g/dL (ref 6.0–8.3)

## 2023-12-26 MED ORDER — OMEPRAZOLE 40 MG PO CPDR: 40.0000 mg | DELAYED_RELEASE_CAPSULE | Freq: Every day | ORAL | 3 refills | Status: AC

## 2023-12-26 NOTE — Patient Instructions (Addendum)
 _______________________________________________________  If your blood pressure at your visit was 140/90 or greater, please contact your primary care physician to follow up on this.  _______________________________________________________  If you are age 45 or older, your body mass index should be between 23-30. Your Body mass index is 24.75 kg/m. If this is out of the aforementioned range listed, please consider follow up with your Primary Care Provider.  If you are age 24 or younger, your body mass index should be between 19-25. Your Body mass index is 24.75 kg/m. If this is out of the aformentioned range listed, please consider follow up with your Primary Care Provider.   ________________________________________________________  The [facility] providers would like to encourage you to use MYCHART to communicate with providers for non-urgent requests or questions.  Due to long hold times on the telephone, sending your provider a message by [facility] may be a faster and more efficient way to get a response.  Please allow 48 business hours for a response.  Please remember that this is for non-urgent requests.  _______________________________________________________  [facility] is using a team-based approach to care.  Your team is made up of your doctor and two to three APPS. Our APPS (Nurse Practitioners and Physician Assistants) work with your physician to ensure care continuity for you. They are fully qualified to address your health concerns and develop a treatment plan. They communicate directly with your gastroenterologist to care for you. Seeing the Advanced Practice Practitioners on your physician's team can help you by facilitating care more promptly, often allowing for earlier appointments, access to diagnostic testing, procedures, and other specialty referrals.   Your provider has requested that you go to the basement level for lab work before leaving today. Press B on the  elevator. The lab is located at the first door on the left as you exit the elevator.  It has been recommended to you by your physician that you have a colonoscopy completed. Per your request, we did not schedule the procedure(s) today. Please contact our office at [phone] should you decide to have the procedure completed. You will be scheduled for a pre-visit and procedure at that time.  Please follow up in 6 months. Give us  a call at [phone] to schedule an appointment.   Please take your proton pump inhibitor medication, omeprazole   Please take this medication 30 minutes to 1 hour before meals- this makes it more effective.  Avoid spicy and acidic foods Avoid fatty foods Limit your intake of coffee, tea, alcohol, and carbonated drinks Work to maintain a healthy weight Keep the head of the bed elevated at least 3 inches with blocks or a wedge pillow if you are having any nighttime symptoms Stay upright for 2 hours after eating Avoid meals and snacks three to four hours before bedtime  Behavioral and Dietary Strategies for Management of Esophageal Dysmotility/dysphagia 1. Take reflux medications 30+ minutes before food in the morning.  2. Begin meals with warm beverage 3. Eat smaller more frequent meals 4. Eat slowly, taking small bites and sips 5. Alternate solids and liquids 6. Avoid foods/liquids that increase acid production 7. Sit upright during and for 30+ minutes after meals to facilitate esophageal clearing 8. Can try altoid melting in mouth before food  IF SYMPTOMS CONTINUE CALL BACK AND WE WILL GET BARIUM SWALLOW  CALL BACK TO SET UP COLONOSCOPY, CAN DO DIRECT ADMIT

## 2023-12-27 ENCOUNTER — Ambulatory Visit: Payer: Self-pay | Admitting: Physician Assistant

## 2023-12-27 LAB — HIV ANTIBODY (ROUTINE TESTING W REFLEX)
HIV 1&2 Ab, 4th Generation: NONREACTIVE
HIV FINAL INTERPRETATION: NEGATIVE

## 2024-01-02 IMAGING — US US ABDOMEN LIMITED
1 series · 15 of 25 positions shown · non-contrast
Comparison: None.

CLINICAL DATA: Epigastric abdominal pain.

EXAM:
ULTRASOUND ABDOMEN LIMITED RIGHT UPPER QUADRANT

[Series 1: us abdomen limited ruq mc & wl · 15 of 44 slices shown]
[im 1/44]
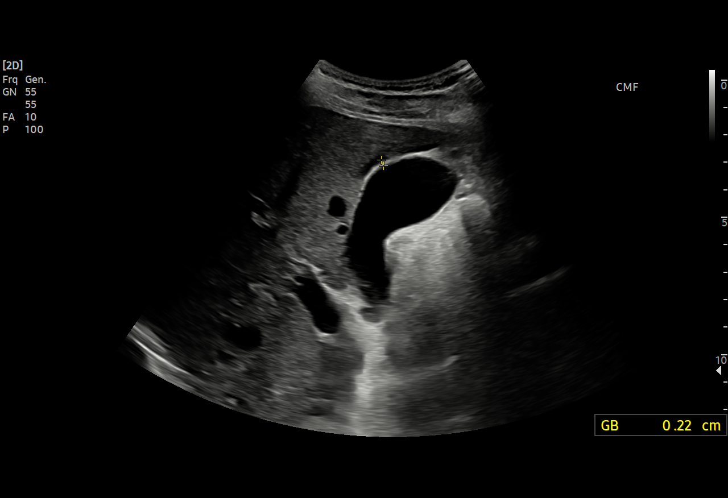
[im 4/44]
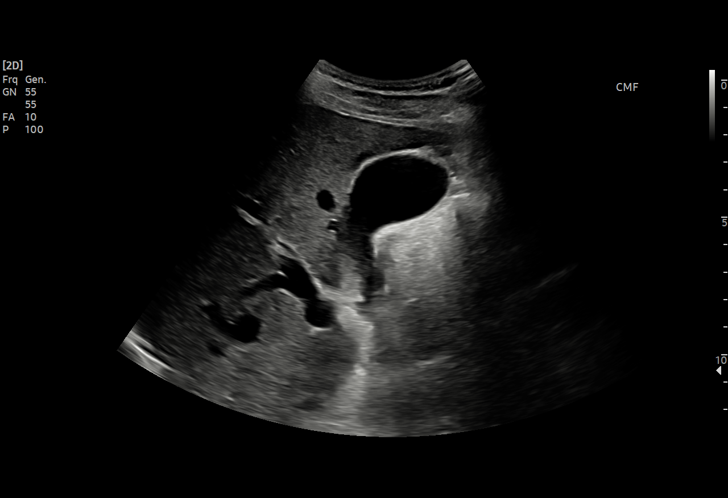
[im 8/44]
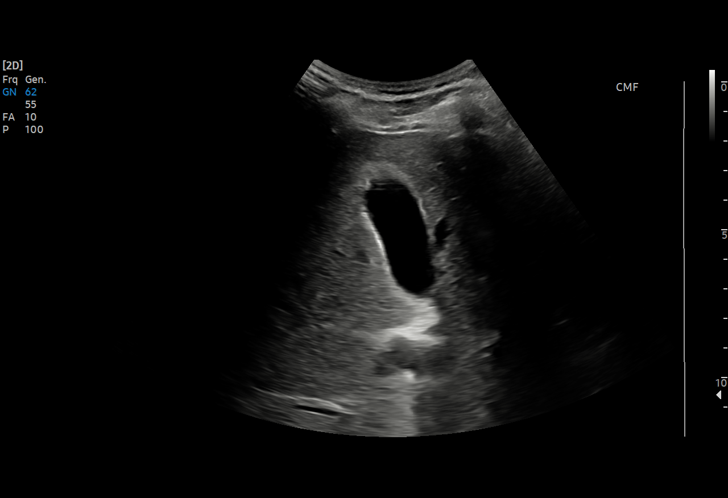
[im 9/44]
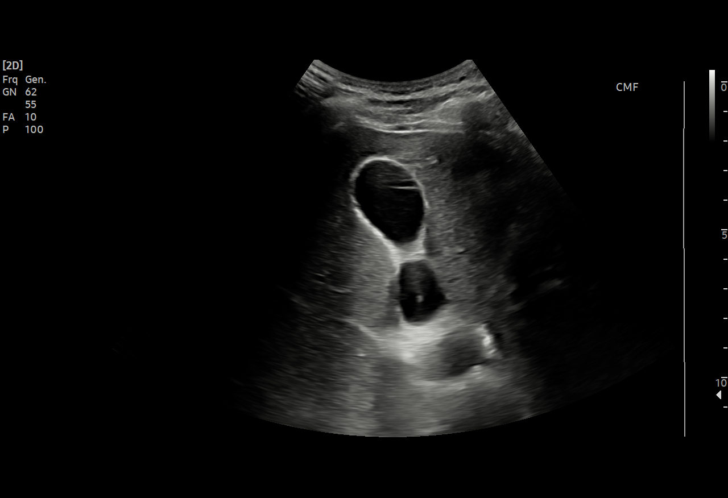
[im 13/44]
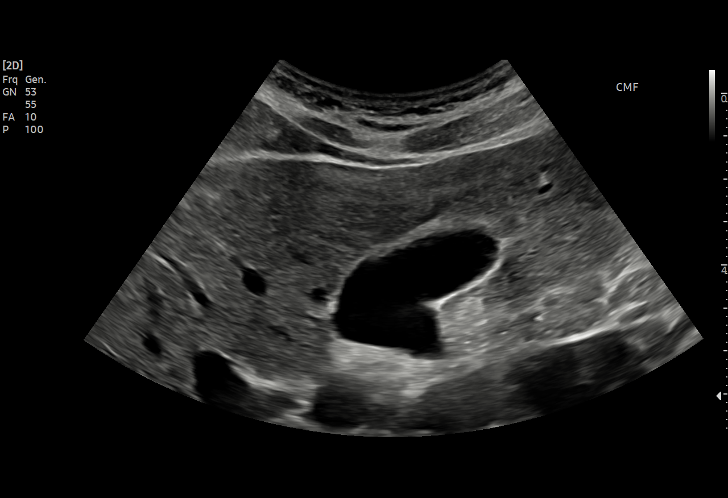
[im 17/44]
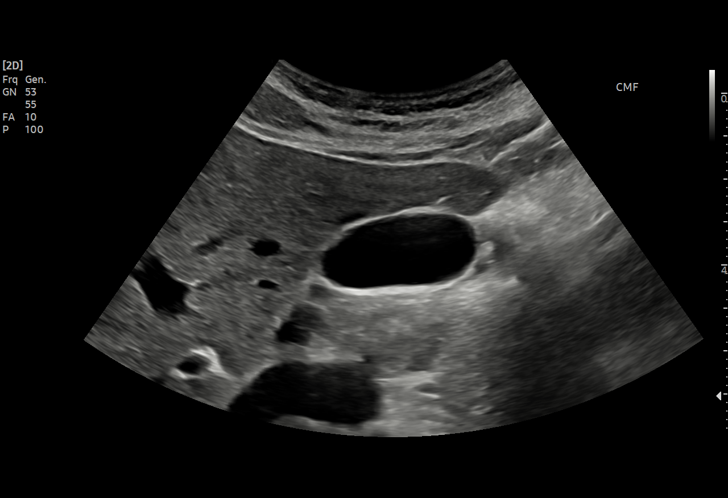
[im 18/44]
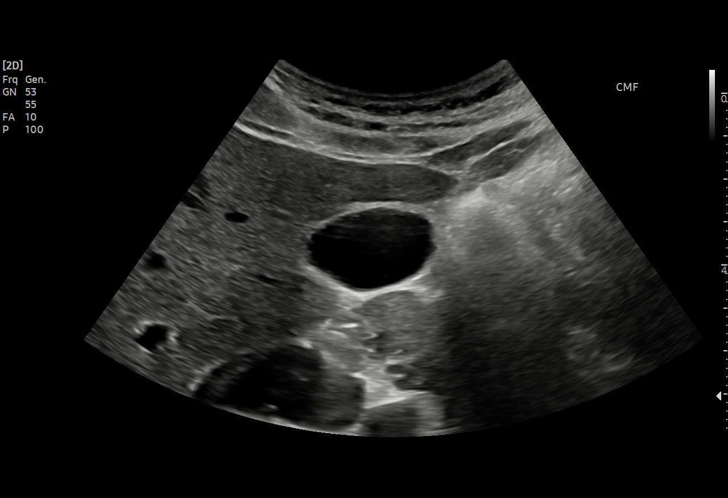
[im 22/44]
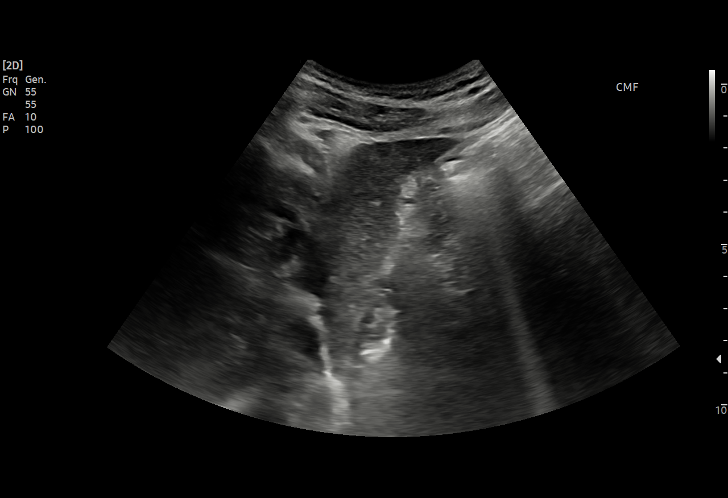
[im 26/44]
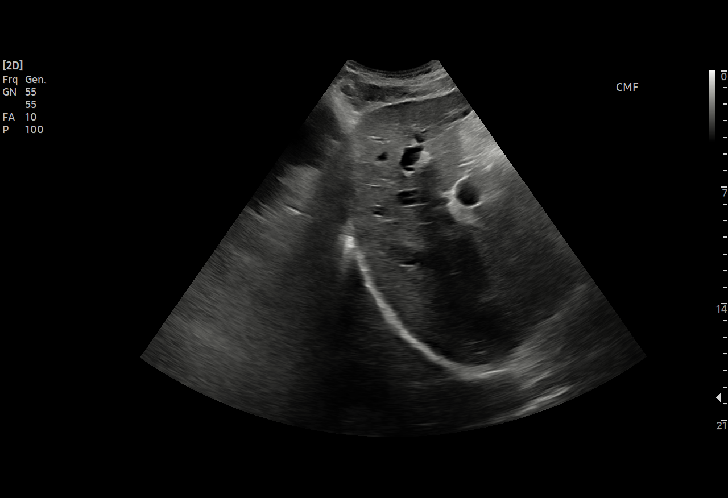
[im 27/44]
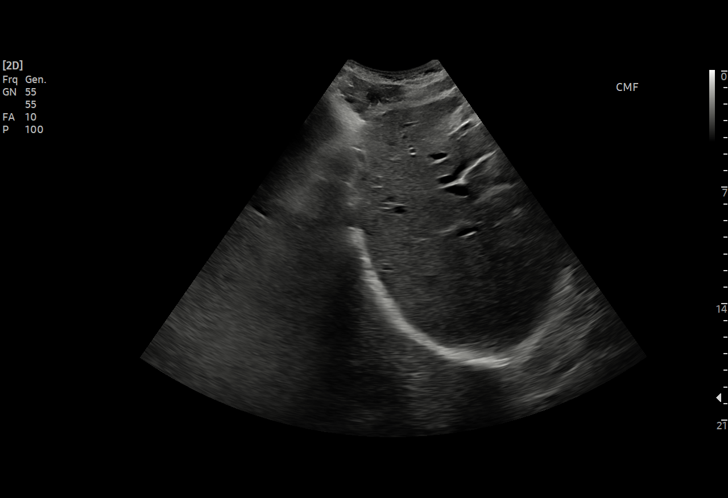
[im 31/44]
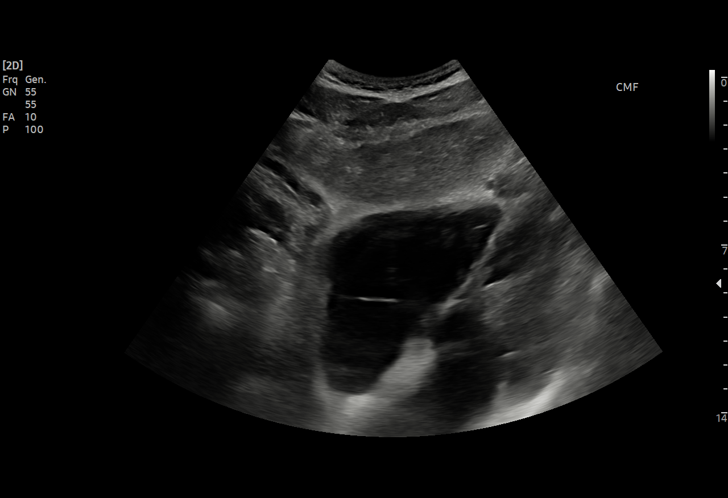
[im 35/44]
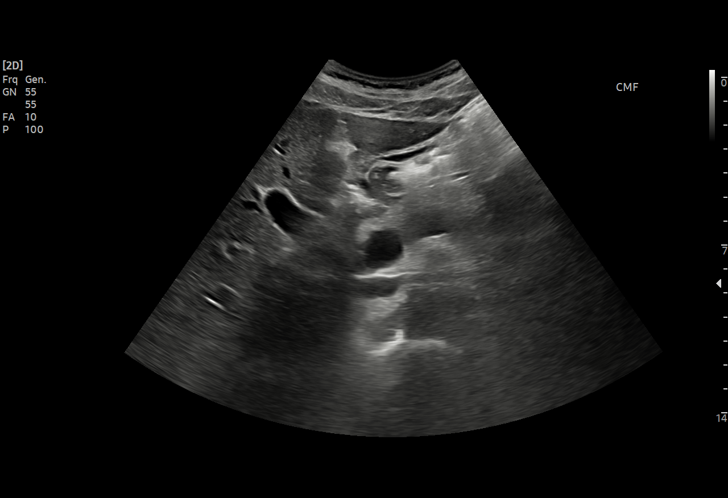
[im 36/44]
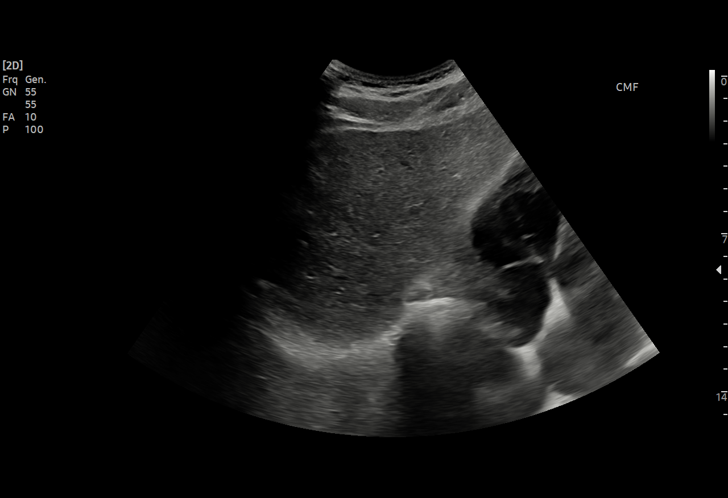
[im 40/44]
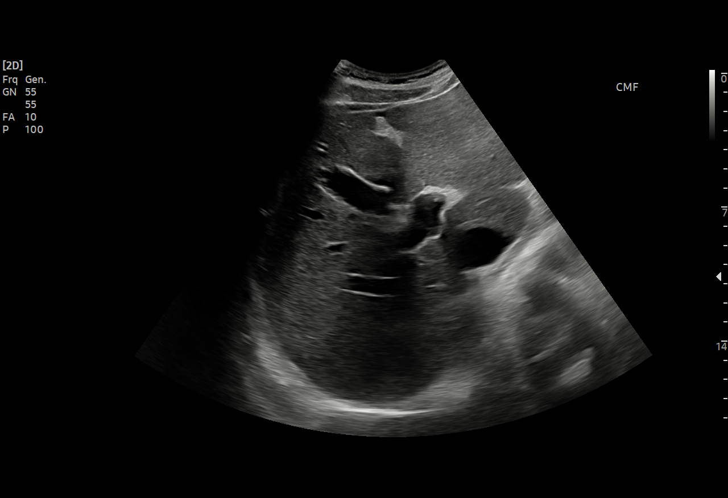
[im 44/44]
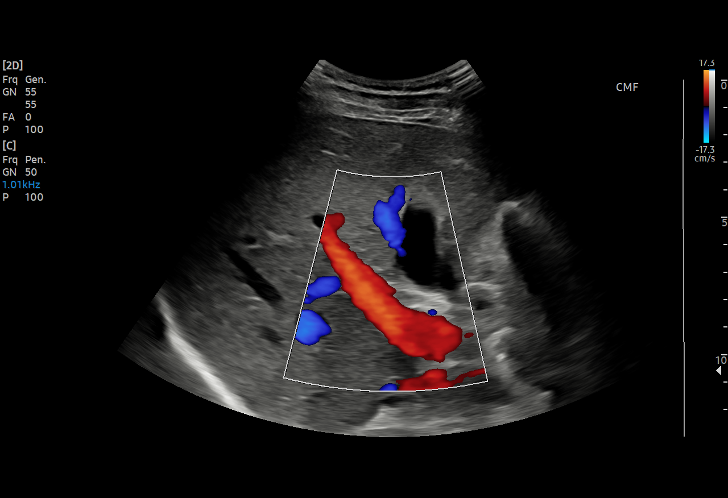

[15 of 25 positions shown; findings below may reference images not displayed]

FINDINGS: Gallbladder:

No gallstones or wall thickening visualized (2.2 mm). No sonographic
Murphy sign noted by sonographer.

Common bile duct:

Diameter: 2.9 mm

Liver:

No focal lesion identified. Within normal limits in parenchymal
echogenicity. Portal vein is patent on color Doppler imaging with
normal direction of blood flow towards the liver.

Other: None.
IMPRESSION: Normal right upper quadrant ultrasound.
